# Patient Record
Sex: Female | Born: 1960 | Race: White | Hispanic: No | Marital: Single | Smoking: Never smoker
Health system: Southern US, Community
[De-identification: ages and names within clinical notes are randomized; demographics above are authoritative.]

## PROBLEM LIST (undated history)

## (undated) DIAGNOSIS — E042 Nontoxic multinodular goiter: Secondary | ICD-10-CM

## (undated) DIAGNOSIS — R32 Unspecified urinary incontinence: Secondary | ICD-10-CM

## (undated) DIAGNOSIS — Z8742 Personal history of other diseases of the female genital tract: Secondary | ICD-10-CM

## (undated) DIAGNOSIS — E282 Polycystic ovarian syndrome: Secondary | ICD-10-CM

## (undated) DIAGNOSIS — M797 Fibromyalgia: Secondary | ICD-10-CM

## (undated) DIAGNOSIS — E349 Endocrine disorder, unspecified: Secondary | ICD-10-CM

## (undated) DIAGNOSIS — E785 Hyperlipidemia, unspecified: Secondary | ICD-10-CM

## (undated) DIAGNOSIS — Z973 Presence of spectacles and contact lenses: Secondary | ICD-10-CM

## (undated) DIAGNOSIS — K219 Gastro-esophageal reflux disease without esophagitis: Secondary | ICD-10-CM

## (undated) DIAGNOSIS — F329 Major depressive disorder, single episode, unspecified: Secondary | ICD-10-CM

## (undated) DIAGNOSIS — N950 Postmenopausal bleeding: Secondary | ICD-10-CM

## (undated) DIAGNOSIS — N393 Stress incontinence (female) (male): Secondary | ICD-10-CM

## (undated) DIAGNOSIS — N981 Hyperstimulation of ovaries: Secondary | ICD-10-CM

## (undated) DIAGNOSIS — E739 Lactose intolerance, unspecified: Secondary | ICD-10-CM

## (undated) DIAGNOSIS — N979 Female infertility, unspecified: Secondary | ICD-10-CM

## (undated) DIAGNOSIS — F411 Generalized anxiety disorder: Secondary | ICD-10-CM

## (undated) DIAGNOSIS — N840 Polyp of corpus uteri: Secondary | ICD-10-CM

## (undated) DIAGNOSIS — N870 Mild cervical dysplasia: Secondary | ICD-10-CM

## (undated) DIAGNOSIS — Z86018 Personal history of other benign neoplasm: Secondary | ICD-10-CM

## (undated) DIAGNOSIS — Z9889 Other specified postprocedural states: Secondary | ICD-10-CM

## (undated) DIAGNOSIS — F419 Anxiety disorder, unspecified: Secondary | ICD-10-CM

## (undated) DIAGNOSIS — F32A Depression, unspecified: Secondary | ICD-10-CM

## (undated) HISTORY — DX: Anxiety disorder, unspecified: F41.9

## (undated) HISTORY — DX: Lactose intolerance, unspecified: E73.9

## (undated) HISTORY — DX: Polycystic ovarian syndrome: E28.2

## (undated) HISTORY — PX: FERTILITY SURGERY: SHX945

## (undated) HISTORY — DX: Endocrine disorder, unspecified: E34.9

## (undated) HISTORY — DX: Mild cervical dysplasia: N87.0

## (undated) HISTORY — DX: Female infertility, unspecified: N97.9

## (undated) HISTORY — PX: ABDOMINOPLASTY: SUR9

## (undated) HISTORY — DX: Hyperstimulation of ovaries: N98.1

## (undated) HISTORY — PX: OOPHORECTOMY: SHX86

## (undated) HISTORY — DX: Major depressive disorder, single episode, unspecified: F32.9

## (undated) HISTORY — PX: COSMETIC SURGERY: SHX468

## (undated) HISTORY — PX: COLPOSCOPY: SHX161

## (undated) HISTORY — PX: DILATION AND CURETTAGE OF UTERUS: SHX78

## (undated) HISTORY — PX: NECK SURGERY: SHX720

## (undated) HISTORY — DX: Hyperlipidemia, unspecified: E78.5

## (undated) HISTORY — DX: Depression, unspecified: F32.A

## (undated) HISTORY — DX: Unspecified urinary incontinence: R32

## (undated) HISTORY — PX: REDUCTION MAMMAPLASTY: SUR839

## (undated) HISTORY — DX: Fibromyalgia: M79.7

---

## 2002-05-15 HISTORY — PX: BREAST REDUCTION SURGERY: SHX8

## 2002-05-15 HISTORY — PX: ABDOMINAL SURGERY: SHX537

## 2017-09-27 ENCOUNTER — Ambulatory Visit: Payer: BC Managed Care – PPO | Admitting: Obstetrics and Gynecology

## 2017-09-27 ENCOUNTER — Other Ambulatory Visit (HOSPITAL_COMMUNITY)
Admission: RE | Admit: 2017-09-27 | Discharge: 2017-09-27 | Disposition: A | Discharge status: Home / Self Care | Payer: BC Managed Care – PPO | Source: Ambulatory Visit | Attending: Obstetrics and Gynecology | Admitting: Obstetrics and Gynecology

## 2017-09-27 ENCOUNTER — Encounter: Payer: Self-pay | Admitting: Obstetrics and Gynecology

## 2017-09-27 ENCOUNTER — Other Ambulatory Visit: Payer: Self-pay

## 2017-09-27 VITALS — BP 112/58 | HR 84 | Resp 16 | Ht 64.25 in | Wt 182.0 lb

## 2017-09-27 DIAGNOSIS — Z1151 Encounter for screening for human papillomavirus (HPV): Secondary | ICD-10-CM | POA: Insufficient documentation

## 2017-09-27 DIAGNOSIS — Z01419 Encounter for gynecological examination (general) (routine) without abnormal findings: Secondary | ICD-10-CM | POA: Diagnosis not present

## 2017-09-27 DIAGNOSIS — N393 Stress incontinence (female) (male): Secondary | ICD-10-CM

## 2017-09-27 DIAGNOSIS — Z113 Encounter for screening for infections with a predominantly sexual mode of transmission: Secondary | ICD-10-CM | POA: Insufficient documentation

## 2017-09-27 DIAGNOSIS — N951 Menopausal and female climacteric states: Secondary | ICD-10-CM | POA: Diagnosis not present

## 2017-09-27 DIAGNOSIS — Z23 Encounter for immunization: Secondary | ICD-10-CM

## 2017-09-27 NOTE — Progress Notes (Signed)
57 y.o. [id number] Single Caucasian female here as a new patient for annual exam and to discuss hormone problems. Patient moved from [place] in October 2018.  Having hot flashes.  Affecting her at work.  Also having anxiety and depression.  Mother and boyfriend died all within a short amount of time.  Started Cymbalta and Buspar one month ago, and hot flashes are less severe. [place] prescribing.  Had recent hormone testing at [facility].  FSH 66.1 and E2 9.5. Normal TSH.   Wants STD testing.   Some urinary leakage with sneeze and cough.  ? Prolapse.   Working as a Teacher, music. Dating.   PCP: No PCP    Patient's last menstrual period was 05/15/2016 (within months).           Sexually active: Yes.    The current method of family planning is condoms most of the time.    Exercising: Yes.    gym Smoker:  no  Health Maintenance: Pap:  Unsure of last pap History of abnormal Pap:  Yes, within the last 2-3 years per patient.  No treatment done.  MMG:  About 1-2 years ago Colonoscopy:  Unsure.  Wants Cologuard.  Already had one kit but did not send it in.  BMD:   Per patient done years ago due to Fibromyalgia TDaP:  Not up to date Gardasil:   n/a HIV: never Hep C: never Screening Labs:  Discuss today    reports that she has never smoked. She has never used smokeless tobacco. She reports that she drinks alcohol. She reports that she does not use drugs.  Past Medical History:  Diagnosis Date  . Anxiety   . Depression   . Hormone disorder   . Infertility, female   . Lactose intolerance   . Ovarian hyperstimulation syndrome   . PCOS (polycystic ovarian syndrome)   . Urinary incontinence     Past Surgical History:  Procedure Laterality Date  . ABDOMINAL SURGERY  2004  . BREAST REDUCTION SURGERY Bilateral 2004  . COLPOSCOPY    . COSMETIC SURGERY    . DILATION AND CURETTAGE OF UTERUS    . FERTILITY SURGERY    . OOPHORECTOMY      Current  Outpatient Medications  Medication Sig Dispense Refill  . busPIRone (BUSPAR) 15 MG tablet Take 1 tablet by mouth 2 (two) times daily.    . DULoxetine (CYMBALTA) 30 MG capsule Take 1 capsule by mouth 2 (two) times daily.    Marland Kitchen LORazepam (ATIVAN) 0.5 MG tablet as needed.     No current facility-administered medications for this visit.     Family History  Problem Relation Age of Onset  . Depression Mother   . Pancreatic cancer Mother   . Diabetes Mother   . Obesity Mother   . Heart Problems Mother   . Depression Father   . Cancer Maternal Grandmother   . Dementia Maternal Grandmother   . Breast cancer Maternal Grandmother   . ADD / ADHD Daughter     Review of Systems  Constitutional: Negative.   HENT: Negative.   Eyes: Negative.   Respiratory: Negative.   Cardiovascular: Negative.   Gastrointestinal: Negative.   Endocrine: Negative.   Genitourinary: Negative.   Musculoskeletal: Negative.   Skin: Negative.   Allergic/Immunologic: Negative.   Neurological: Negative.   Hematological: Negative.   Psychiatric/Behavioral: Negative.     Exam:   BP (!) 112/58 (BP Location: Right Arm, Patient Position: Sitting, Cuff Size:  Large)   Pulse 84   Resp 16   Ht 5' 4.25" (1.632 m)   Wt 182 lb (82.6 kg)   LMP 05/15/2016 (Within Months)   BMI 31.00 kg/m     General appearance: alert, cooperative and appears stated age Head: Normocephalic, without obvious abnormality, atraumatic Neck: no adenopathy, supple, symmetrical, trachea midline and thyroid normal to inspection and palpation Lungs: clear to auscultation bilaterally Breasts: normal appearance, no masses or tenderness, No nipple retraction or dimpling, No nipple discharge or bleeding, No axillary or supraclavicular adenopathy Heart: regular rate and rhythm Abdomen: soft, non-tender; no masses, no organomegaly Extremities: extremities normal, atraumatic, no cyanosis or edema Skin: Skin color, texture, turgor normal. No rashes or  lesions Lymph nodes: Cervical, supraclavicular, and axillary nodes normal. No abnormal inguinal nodes palpated Neurologic: Grossly normal  Pelvic: External genitalia:  no lesions              Urethra:  normal appearing urethra with no masses, tenderness or lesions              Bartholins and Skenes: normal                 Vagina: normal appearing vagina with normal color and discharge, no lesions              Cervix: no lesions              Pap taken: Yes.   Bimanual Exam:  Uterus:  normal size, contour, position, consistency, mobility, non-tender              Adnexa: no mass, fullness, tenderness              Rectal exam: Yes.  .  Confirms.              Anus:  normal sphincter tone, no lesions  Chaperone was present for exam.  Assessment:   Well woman visit with normal exam. STD screening.  GSI.  Bereavement and depression. Menopausal symptoms.   Plan: Mammogram screening. Information about mammogram screening centers in [place].  She will make an appointment.  Recommended self breast awareness. Pap and HR HPV as above. Guidelines for Calcium, Vitamin D, regular exercise program including cardiovascular and weight bearing exercise. Routine labs and STD screening.   Discussed Kegel's, PT, Impressa, and surgery for urinary incontience.  Cologuard.  TDap.  We discussed the multiple benefits of Cymbalta to treat depression and anxiety, urinary stress incontinence and menopausal vasomotor symptoms.  Name of [facility] to patient.  I do not recommend initiating HRT due to risks of DVT, PE, MI, stroke and breast cancer. Follow up annually and prn.   After visit summary provided.

## 2017-09-27 NOTE — Patient Instructions (Signed)

## 2017-09-28 LAB — CBC
Hematocrit: 39.2 % (ref 34.0–46.6)
Hemoglobin: 13.3 g/dL (ref 11.1–15.9)
MCH: 30.3 pg (ref 26.6–33.0)
MCHC: 33.9 g/dL (ref 31.5–35.7)
MCV: 89 fL (ref 79–97)
PLATELETS: 360 10*3/uL (ref 150–379)
RBC: 4.39 x10E6/uL (ref 3.77–5.28)
RDW: 13.6 % (ref 12.3–15.4)
WBC: 6.6 10*3/uL (ref 3.4–10.8)

## 2017-09-28 LAB — LIPID PANEL
CHOLESTEROL TOTAL: 236 mg/dL — AB (ref 100–199)
Chol/HDL Ratio: 5.9 ratio — ABNORMAL HIGH (ref 0.0–4.4)
HDL: 40 mg/dL (ref >39)
LDL CALC: 142 mg/dL — AB (ref 0–99)
TRIGLYCERIDES: 272 mg/dL — AB (ref 0–149)
VLDL CHOLESTEROL CAL: 54 mg/dL — AB (ref 5–40)

## 2017-09-28 LAB — VITAMIN D 25 HYDROXY (VIT D DEFICIENCY, FRACTURES): Vit D, 25-Hydroxy: 25.7 ng/mL — ABNORMAL LOW (ref 30.0–100.0)

## 2017-09-28 LAB — COMPREHENSIVE METABOLIC PANEL
ALT: 20 IU/L (ref 0–32)
AST: 18 IU/L (ref 0–40)
Albumin/Globulin Ratio: 2 (ref 1.2–2.2)
Albumin: 4.5 g/dL (ref 3.5–5.5)
Alkaline Phosphatase: 86 IU/L (ref 39–117)
BUN/Creatinine Ratio: 18 (ref 9–23)
BUN: 14 mg/dL (ref 6–24)
CO2: 24 mmol/L (ref 20–29)
Calcium: 9.8 mg/dL (ref 8.7–10.2)
Chloride: 106 mmol/L (ref 96–106)
Creatinine, Ser: 0.79 mg/dL (ref 0.57–1.00)
GFR calc non Af Amer: 85 mL/min/{1.73_m2} (ref >59)
GFR, EST AFRICAN AMERICAN: 98 mL/min/{1.73_m2} (ref >59)
Globulin, Total: 2.2 g/dL (ref 1.5–4.5)
Glucose: 94 mg/dL (ref 65–99)
Potassium: 4.8 mmol/L (ref 3.5–5.2)
Sodium: 145 mmol/L — ABNORMAL HIGH (ref 134–144)
TOTAL PROTEIN: 6.7 g/dL (ref 6.0–8.5)

## 2017-09-28 LAB — HEP, RPR, HIV PANEL
HIV SCREEN 4TH GENERATION: NONREACTIVE
Hepatitis B Surface Ag: NEGATIVE
RPR Ser Ql: NONREACTIVE

## 2017-09-28 LAB — HEPATITIS C ANTIBODY: Hep C Virus Ab: <0.1 s/co ratio (ref 0.0–0.9)

## 2017-09-29 DIAGNOSIS — N3946 Mixed incontinence: Secondary | ICD-10-CM | POA: Insufficient documentation

## 2017-09-29 DIAGNOSIS — N951 Menopausal and female climacteric states: Secondary | ICD-10-CM | POA: Insufficient documentation

## 2017-09-29 DIAGNOSIS — N393 Stress incontinence (female) (male): Secondary | ICD-10-CM | POA: Insufficient documentation

## 2017-10-01 ENCOUNTER — Encounter: Payer: Self-pay | Admitting: Obstetrics and Gynecology

## 2017-10-02 ENCOUNTER — Telehealth: Payer: Self-pay | Admitting: *Deleted

## 2017-10-02 LAB — CYTOLOGY - PAP
Chlamydia: NEGATIVE
DIAGNOSIS: NEGATIVE
HPV: NOT DETECTED
Neisseria Gonorrhea: NEGATIVE
TRICH (WINDOWPATH): NEGATIVE

## 2017-10-02 NOTE — Telephone Encounter (Signed)
-----   Message from Patton Salles, MD sent at 10/01/2017 11:13 AM EDT ----- Please contact patient with results from her office visit.  Her cholesterol is quite elevated, and I would like to have [place] set up an appointment for her to see a PCP to follow this.  She is currently at risk for cardiovascular disease development.  I do recommend she start a diet low in cholesterol and low in saturated fats.  A vigorous exercise plan will also be beneficial.  Her vitamin D level is low, and I recommend she start taking vit D3 800 IU daily.   Her blood chemistries showed a sodium level which is one point above normal.  Her blood counts are normal.   Testing is negative for HIV, syphilis, and hepatitis B and C.   Her pap is pending.

## 2017-10-02 NOTE — Telephone Encounter (Signed)
Notes recorded by Leda Min, RN on 10/02/2017 at 10:02 AM EDT Left message to call Noreene Larsson at [phone].

## 2017-10-05 NOTE — Telephone Encounter (Signed)
Notes recorded by Patton Salles, MD on 10/04/2017 at 6:07 AM EDT Results to patient through My Chart. Pap recall - 08. History of abnormal pap and no tx 2 - 3 years ago at out [place] office.  Hello Anna Chavez,  I am sharing your test results from your recent visit.   Your pap and high risk HPV testing are normal and negative.   Testing is negative for gonorrhea, chlamydia, and trichomonas.  Please contact the office for any questions.   Have a good day!  Conley Simmonds, MD

## 2017-10-09 ENCOUNTER — Other Ambulatory Visit: Payer: Self-pay | Admitting: Obstetrics and Gynecology

## 2017-10-09 DIAGNOSIS — Z1231 Encounter for screening mammogram for malignant neoplasm of breast: Secondary | ICD-10-CM

## 2017-10-09 NOTE — Telephone Encounter (Signed)
Spoke with patient, advised as seen below per Dr. Edward Jolly. Patient does not have PCP and declines assistance with scheduling with PCP. Advised patient to return call to office if assistance needed. Patient verbalizes understanding.   Pap results Viewed by Lawrence Marseilles on 10/08/2017 2:57 PM   Routing to provider for final review. Patient is agreeable to disposition. Will close encounter.

## 2017-11-09 ENCOUNTER — Inpatient Hospital Stay: Admission: RE | Admit: 2017-11-09 | Payer: BC Managed Care – PPO | Source: Ambulatory Visit

## 2017-11-19 ENCOUNTER — Ambulatory Visit
Admission: RE | Admit: 2017-11-19 | Discharge: 2017-11-19 | Disposition: A | Discharge status: Home / Self Care | Payer: BC Managed Care – PPO | Source: Ambulatory Visit | Attending: Obstetrics and Gynecology | Admitting: Obstetrics and Gynecology

## 2017-11-19 DIAGNOSIS — Z1231 Encounter for screening mammogram for malignant neoplasm of breast: Secondary | ICD-10-CM

## 2017-11-19 LAB — COLOGUARD: Cologuard: NEGATIVE

## 2017-11-27 ENCOUNTER — Telehealth: Payer: Self-pay | Admitting: Obstetrics and Gynecology

## 2017-11-27 DIAGNOSIS — Z1211 Encounter for screening for malignant neoplasm of colon: Secondary | ICD-10-CM

## 2017-11-27 DIAGNOSIS — Z1212 Encounter for screening for malignant neoplasm of rectum: Principal | ICD-10-CM

## 2017-11-27 NOTE — Telephone Encounter (Signed)
Please contact patient to report negative Cologuard test result.

## 2017-11-28 NOTE — Telephone Encounter (Signed)
No answer, unable to leave message °

## 2017-11-29 NOTE — Telephone Encounter (Signed)
Spoke with patient, advised as seen below per Dr. Edward JollySilva. Patient verbalizes understanding and is agreeable. Encounter closed.   Results entered into Epic. Copy of results to scan.

## 2018-01-08 ENCOUNTER — Encounter: Payer: Self-pay | Admitting: Obstetrics and Gynecology

## 2018-05-15 DIAGNOSIS — B009 Herpesviral infection, unspecified: Secondary | ICD-10-CM

## 2018-05-15 HISTORY — DX: Herpesviral infection, unspecified: B00.9

## 2018-06-12 ENCOUNTER — Ambulatory Visit (HOSPITAL_COMMUNITY)
Admission: RE | Admit: 2018-06-12 | Discharge: 2018-06-12 | Disposition: A | Discharge status: Home / Self Care | Payer: BC Managed Care – PPO | Attending: Psychiatry | Admitting: Psychiatry

## 2018-06-12 DIAGNOSIS — R45851 Suicidal ideations: Secondary | ICD-10-CM | POA: Insufficient documentation

## 2018-06-12 DIAGNOSIS — F332 Major depressive disorder, recurrent severe without psychotic features: Secondary | ICD-10-CM | POA: Insufficient documentation

## 2018-06-12 DIAGNOSIS — Z818 Family history of other mental and behavioral disorders: Secondary | ICD-10-CM | POA: Diagnosis not present
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
## 2018-06-13 ENCOUNTER — Telehealth (HOSPITAL_COMMUNITY): Payer: Self-pay | Admitting: Psychiatry

## 2018-10-11 ENCOUNTER — Other Ambulatory Visit: Payer: Self-pay

## 2018-10-11 ENCOUNTER — Ambulatory Visit: Payer: BC Managed Care – PPO | Admitting: Obstetrics and Gynecology

## 2018-10-11 ENCOUNTER — Telehealth: Payer: Self-pay | Admitting: Obstetrics and Gynecology

## 2018-10-11 ENCOUNTER — Encounter: Payer: Self-pay | Admitting: Obstetrics and Gynecology

## 2018-10-11 ENCOUNTER — Other Ambulatory Visit (HOSPITAL_COMMUNITY)
Admission: RE | Admit: 2018-10-11 | Discharge: 2018-10-11 | Disposition: A | Discharge status: Home / Self Care | Payer: BC Managed Care – PPO | Source: Ambulatory Visit | Attending: Obstetrics and Gynecology | Admitting: Obstetrics and Gynecology

## 2018-10-11 VITALS — BP 120/74 | HR 70 | Temp 97.8°F | Resp 16

## 2018-10-11 DIAGNOSIS — Z113 Encounter for screening for infections with a predominantly sexual mode of transmission: Secondary | ICD-10-CM | POA: Diagnosis not present

## 2018-10-11 DIAGNOSIS — N9089 Other specified noninflammatory disorders of vulva and perineum: Secondary | ICD-10-CM | POA: Diagnosis not present

## 2018-10-11 MED ORDER — VALACYCLOVIR HCL 1 G PO TABS: 1000.0000 mg | ORAL_TABLET | Freq: Two times a day (BID) | ORAL | 1 refills | Status: DC

## 2018-10-11 MED ORDER — LIDOCAINE 5 % EX OINT: 1.0000 "application " | TOPICAL_OINTMENT | Freq: Three times a day (TID) | CUTANEOUS | 1 refills | Status: DC

## 2018-10-11 NOTE — Telephone Encounter (Signed)
Patient is having vaginal discomfort, no further details given.

## 2018-10-11 NOTE — Progress Notes (Signed)
GYNECOLOGY  VISIT   HPI: 58 y.o.   Single  Caucasian  female   G2P0002 with No LMP recorded. Patient is perimenopausal.   here for bleeding and soreness after intercourse. Discharge noted.   Symptoms for a couple of days.   Recent new sexual partner after not being sexually active for over one year.  She states he said he was tested and negative for infection, but now she is worried.  States blood noted after intercourse but no spontaneous bleeding.    She noted swelling and throbbing in the vagina.  Vagina felt heavy and painful.  Also noted a pimple on the vaginal lip that has become a lump.  Clear discharge.  No odor.  No itching.  No use of lubricants or products.   She feels something dropping down in her vagina especially when when is standing. States she noted it with wiping and she is concerned about this.  She does have urinary incontinence.  States no pain with urination.  At first, it felt difficult to void, but this is easier now.  Denies hx of HSV 1.   Going to back to work on Monday since the Covid 19 pandemic began.   GYNECOLOGIC HISTORY: No LMP recorded. Patient is perimenopausal. Contraception:  Condoms Menopausal hormone therapy:  none Last mammogram:  11/20/17 BIRADS 1 negative/density b Last pap smear:   09/27/17 Neg:Neg HR HPV        OB History    Gravida  2   Para  2   Term  0   Preterm  0   AB  0   Living  2     SAB  0   TAB  0   Ectopic  0   Multiple  0   Live Births  0              Patient Active Problem List   Diagnosis Date Noted  . Stress incontinence 09/29/2017  . Menopausal symptoms 09/29/2017    Past Medical History:  Diagnosis Date  . Anxiety   . Depression   . Fibromyalgia   . Hormone disorder   . Hyperlipidemia   . Infertility, female   . Lactose intolerance   . Ovarian hyperstimulation syndrome   . PCOS (polycystic ovarian syndrome)   . Urinary incontinence     Past Surgical History:  Procedure  Laterality Date  . ABDOMINAL SURGERY  2004  . BREAST REDUCTION SURGERY Bilateral 2004  . COLPOSCOPY    . COSMETIC SURGERY    . DILATION AND CURETTAGE OF UTERUS    . FERTILITY SURGERY    . OOPHORECTOMY    . REDUCTION MAMMAPLASTY Bilateral     Current Outpatient Medications  Medication Sig Dispense Refill  . busPIRone (BUSPAR) 15 MG tablet Take 1 tablet by mouth 2 (two) times daily.    . DULoxetine (CYMBALTA) 30 MG capsule Take by mouth.     No current facility-administered medications for this visit.      ALLERGIES: Patient has no known allergies.  Family History  Problem Relation Age of Onset  . Depression Mother   . Pancreatic cancer Mother   . Diabetes Mother   . Obesity Mother   . Heart Problems Mother   . Depression Father   . Cancer Maternal Grandmother   . Dementia Maternal Grandmother   . Breast cancer Maternal Grandmother   . ADD / ADHD Daughter     Social History   Socioeconomic History  . Marital  status: Single    Spouse name: Not on file  . Number of children: Not on file  . Years of education: Not on file  . Highest education level: Not on file  Occupational History  . Not on file  Social Needs  . Financial resource strain: Not on file  . Food insecurity:    Worry: Not on file    Inability: Not on file  . Transportation needs:    Medical: Not on file    Non-medical: Not on file  Tobacco Use  . Smoking status: Never Smoker  . Smokeless tobacco: Never Used  Substance and Sexual Activity  . Alcohol use: Yes    Comment: occasional  . Drug use: Never  . Sexual activity: Yes    Birth control/protection: Post-menopausal  Lifestyle  . Physical activity:    Days per week: Not on file    Minutes per session: Not on file  . Stress: Not on file  Relationships  . Social connections:    Talks on phone: Not on file    Gets together: Not on file    Attends religious service: Not on file    Active member of club or organization: Not on file     Attends meetings of clubs or organizations: Not on file    Relationship status: Not on file  . Intimate partner violence:    Fear of current or ex partner: Not on file    Emotionally abused: Not on file    Physically abused: Not on file    Forced sexual activity: Not on file  Other Topics Concern  . Not on file  Social History Narrative  . Not on file    Review of Systems  Constitutional: Negative.   HENT: Negative.   Eyes: Negative.   Respiratory: Negative.   Cardiovascular: Negative.   Gastrointestinal: Negative.   Endocrine: Negative.   Genitourinary: Negative.        Bump on vagina, discharge, pain & bleeding after intercourse  Musculoskeletal: Negative.   Skin: Negative.   Allergic/Immunologic: Negative.   Neurological: Negative.   Psychiatric/Behavioral: Negative.     PHYSICAL EXAMINATION:    BP 120/74   Pulse 70   Temp 97.8 F (36.6 C) (Oral)   Resp 16     General appearance: anxious and tearful.  Pelvic: External genitalia:  Plaque of satellite ulceration on the right mons pubis.              Urethra:  normal appearing urethra with no masses, tenderness or lesions              Bartholins and Skenes: normal                 Vagina:  Inflammation of the vagina and cervix.  Multiple ulcerations of the vaginal apex and cervix.              Cervix:  See above.  Mucousy discharge.                Bimanual Exam:  Uterus:  normal size, contour, position, consistency, mobility, non-tender              Adnexa: no mass, fullness, tenderness      Chaperone was present for exam.  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  Minimal cystocele.  Situational anxiety.  PLAN  HSV 1 and 2 discussed in detail - route of infection, signs and symptoms, recurrences, asymptomatic shedding, antiviral treatment.  STD screening.  HSV 1  and 2 from [facility].  Valtrex 1000 mg po q 12 hours x 10 days.  She may take Valtrex 1000 mg daily for suppression dosing.  Support given for anxiety related to  likely dx.  FU for annual exam in 4 weeks.     An After Visit Summary was printed and given to the patient.  ___40___ minutes face to face time of which over 50% was spent in counseling.

## 2018-10-11 NOTE — Telephone Encounter (Signed)
Spoke with patient. Patient is anxious, states she is "freaking out after looking at WebMD". Describes painful bumps on vagina. Has a new partner, last SA 4-5 days ago. Reports feeling sore and bleeding after intercourse. "Wet" vaginal d/c, no odor. Patient is menopausal. Covid-pre-screen negative, precautions reviewed. OV scheduled for today at 4pm with Dr. Edward Jolly.   Encounter closed.

## 2018-10-11 NOTE — Patient Instructions (Signed)
Genital Herpes °Genital herpes is a common sexually transmitted infection (STI) that is caused by a virus. The virus spreads from person to person through sexual contact. Infection can cause itching, blisters, and sores around the genitals or rectum. Symptoms may last several days and then go away This is called an outbreak. However, the virus remains in your body, so you may have more outbreaks in the future. The time between outbreaks varies and can be months or years. °Genital herpes affects men and women. It is particularly concerning for pregnant women because the virus can be passed to the baby during delivery and can cause serious problems. Genital herpes is also a concern for people who have a weak disease-fighting (immune) system. °What are the causes? °This condition is caused by the herpes simplex virus (HSV) type 1 or type 2. The virus may spread through: °· Sexual contact with an infected person, including vaginal, anal, and oral sex. °· Contact with fluid from a herpes sore. °· The skin. This means that you can get herpes from an infected partner even if he or she does not have a visible sore or does not know that he or she is infected. °What increases the risk? °You are more likely to develop this condition if: °· You have sex with many partners. °· You do not use latex condoms during sex. °What are the signs or symptoms? °Most people do not have symptoms (asymptomatic) or have mild symptoms that may be mistaken for other skin problems. Symptoms may include: °· Small red bumps near the genitals, rectum, or mouth. These bumps turn into blisters and then turn into sores. °· Flu-like symptoms, including: °? Fever. °? Body aches. °? Swollen lymph nodes. °? Headache. °· Painful urination. °· Pain and itching in the genital area or rectal area. °· Vaginal discharge. °· Tingling or shooting pain in the legs and buttocks. °Generally, symptoms are more severe and last longer during the first (primary)  outbreak. Flu-like symptoms are also more common during the primary outbreak. °How is this diagnosed? °Genital herpes may be diagnosed based on: °· A physical exam. °· Your medical history. °· Blood tests. °· A test of a fluid sample (culture) from an open sore. °How is this treated? °There is no cure for this condition, but treatment with antiviral medicines that are taken by mouth (orally) can do the following: °· Speed up healing and relieve symptoms. °· Help to reduce the spread of the virus to sexual partners. °· Limit the chance of future outbreaks, or make future outbreaks shorter. °· Lessen symptoms of future outbreaks. °Your health care provider may also recommend pain relief medicines, such as aspirin or ibuprofen. °Follow these instructions at home: °Sexual activity °· Do not have sexual contact during active outbreaks. °· Practice safe sex. Latex condoms and female condoms may help prevent the spread of the herpes virus. °General instructions °· Keep the affected areas dry and clean. °· Take over-the-counter and prescription medicines only as told by your health care provider. °· Avoid rubbing or touching blisters and sores. If you do touch blisters or sores: °? Wash your hands thoroughly with soap and water. °? Do not touch your eyes afterward. °· To help relieve pain or itching, you may take the following actions as directed by your health care provider: °? Apply a cold, wet cloth (cold compress) to affected areas 4-6 times a day. °? Apply a substance that protects your skin and reduces bleeding (astringent). °? Apply a gel that   helps relieve pain around sores (lidocaine gel). °? Take a warm, shallow bath that cleans the genital area (sitz bath). °· Keep all follow-up visits as told by your health care provider. This is important. °How is this prevented? °· Use condoms. Although anyone can get genital herpes during sexual contact, even with the use of a condom, a condom can provide some  protection. °· Avoid having multiple sexual partners. °· Talk with your sexual partner about any symptoms either of you may have. Also, talk with your partner about any history of STIs. °· Get tested for STIs before you have sex. Ask your partner to do the same. °· Do not have sexual contact if you have symptoms of genital herpes. °Contact a health care provider if: °· Your symptoms are not improving with medicine. °· Your symptoms return. °· You have new symptoms. °· You have a fever. °· You have abdominal pain. °· You have redness, swelling, or pain in your eye. °· You notice new sores on other parts of your body. °· You are a woman and experience bleeding between menstrual periods. °· You have had herpes and you become pregnant or plan to become pregnant. °Summary °· Genital herpes is a common sexually transmitted infection (STI) that is caused by the herpes simplex virus (HSV) type 1 or type 2. °· These viruses are most often spread through sexual contact with an infected person. °· You are more likely to develop this condition if you have sex with many partners or you have unprotected sex. °· Most people do not have symptoms (asymptomatic) or have mild symptoms that may be mistaken for other skin problems. Symptoms occur as outbreaks that may happen months or years apart. °· There is no cure for this condition, but treatment with oral antiviral medicines can reduce symptoms, reduce the chance of spreading the virus to a partner, prevent future outbreaks, or shorten future outbreaks. °This information is not intended to replace advice given to you by your health care provider. Make sure you discuss any questions you have with your health care provider. °Document Released: 04/28/2000 Document Revised: 03/31/2016 Document Reviewed: 03/31/2016 °Elsevier Interactive Patient Education © 2019 Elsevier Inc. ° °

## 2018-10-12 LAB — HEP, RPR, HIV PANEL
HIV Screen 4th Generation wRfx: NONREACTIVE
Hepatitis B Surface Ag: NEGATIVE
RPR Ser Ql: NONREACTIVE

## 2018-10-12 LAB — HEPATITIS C ANTIBODY: Hep C Virus Ab: <0.1 s/co ratio (ref 0.0–0.9)

## 2018-10-14 ENCOUNTER — Telehealth: Payer: Self-pay | Admitting: Obstetrics and Gynecology

## 2018-10-14 NOTE — Telephone Encounter (Signed)
Left message to call Noreene Larsson, RN at [facility] [phone].     Written by Patton Salles, MD on 10/14/2018 12:50 PM  Hello Corrine,   I am sharing your test results from your recent visit.  Blood testing is negative for HIV, syphilis, and hepatitis B and C.   The remaining testing is still being processed.   Please contact the office for any questions.   I will be in touch when the remaining tests are ready.   Conley Simmonds, MD

## 2018-10-14 NOTE — Telephone Encounter (Signed)
Patient is calling for her recent results. She states she has an alert on MyChart but cannot access the results.

## 2018-10-14 NOTE — Telephone Encounter (Signed)
Patient is returning call to [facility]. Patient requested a call back between 12:30pm and 2pm tomorrow (6/2)

## 2018-10-15 LAB — CERVICOVAGINAL ANCILLARY ONLY
Chlamydia: NEGATIVE
Neisseria Gonorrhea: NEGATIVE
Trichomonas: NEGATIVE

## 2018-10-15 NOTE — Telephone Encounter (Signed)
Returned call to patient, left message to call me back.

## 2018-10-15 NOTE — Telephone Encounter (Signed)
Spoke with patient and reviewed negative blood work. Advised remaining testing is pending. She will call back by Friday if hasn't heard results from other tests.  She prefers results not to be sent through [facility] cannot access--prefers phone call.

## 2018-10-18 ENCOUNTER — Telehealth: Payer: Self-pay | Admitting: Obstetrics and Gynecology

## 2018-10-18 NOTE — Telephone Encounter (Signed)
Patient calling for any additional results.

## 2018-10-18 NOTE — Telephone Encounter (Signed)
Advised patient HSV testing still pending. We have called lab and they state it should be resulting during the weekend and we'll have results first of the week. Patient thanked me for calling her.

## 2018-10-19 LAB — HSV NAA
HSV 1 NAA: NEGATIVE
HSV 2 NAA: POSITIVE — AB

## 2018-10-21 ENCOUNTER — Encounter: Payer: Self-pay | Admitting: Obstetrics and Gynecology

## 2018-10-21 ENCOUNTER — Telehealth: Payer: Self-pay | Admitting: *Deleted

## 2018-10-21 NOTE — Telephone Encounter (Signed)
Spoke with patient and notified of positive HSV II. Offered follow up appointment to discuss further with Dr.Silva and she states she has appointment on 11-11-18 for AEX.

## 2018-10-21 NOTE — Telephone Encounter (Signed)
Notes recorded by Burnice Logan, RN on 10/21/2018 at 10:11 AM EDT Left message to call Sharee Pimple, RN at [place]

## 2018-10-21 NOTE — Telephone Encounter (Signed)
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  We discussed probable herpes diagnosis at the time of her office visit.  She may wish to schedule a follow up visit with me. I would recommend an in office visit if she desires the follow up.

## 2018-10-28 ENCOUNTER — Other Ambulatory Visit: Payer: Self-pay | Admitting: Obstetrics and Gynecology

## 2018-10-28 DIAGNOSIS — Z1231 Encounter for screening mammogram for malignant neoplasm of breast: Secondary | ICD-10-CM

## 2018-11-02 ENCOUNTER — Other Ambulatory Visit: Payer: Self-pay | Admitting: Obstetrics and Gynecology

## 2018-11-04 NOTE — Telephone Encounter (Signed)
Medication refill request: valtrex 1000 Last AEX:  09/27/17 Next AEX: 11/11/18 Last MMG (if hormonal medication request): 11/19/17 Bi-rads 1 Neg  Refill authorized: #50 with 1 RF

## 2018-11-11 ENCOUNTER — Encounter: Payer: Self-pay | Admitting: Obstetrics and Gynecology

## 2018-11-11 ENCOUNTER — Ambulatory Visit: Payer: BC Managed Care – PPO | Admitting: Obstetrics and Gynecology

## 2018-11-11 ENCOUNTER — Other Ambulatory Visit: Payer: Self-pay

## 2018-11-11 ENCOUNTER — Other Ambulatory Visit (HOSPITAL_COMMUNITY)
Admission: RE | Admit: 2018-11-11 | Discharge: 2018-11-11 | Disposition: A | Discharge status: Home / Self Care | Payer: BC Managed Care – PPO | Source: Ambulatory Visit | Attending: Obstetrics and Gynecology | Admitting: Obstetrics and Gynecology

## 2018-11-11 VITALS — BP 118/64 | HR 76 | Temp 97.5°F | Resp 14 | Ht 64.0 in | Wt 187.0 lb

## 2018-11-11 DIAGNOSIS — Z01419 Encounter for gynecological examination (general) (routine) without abnormal findings: Secondary | ICD-10-CM

## 2018-11-11 MED ORDER — VALACYCLOVIR HCL 1 G PO TABS: ORAL_TABLET | ORAL | 11 refills | Status: DC

## 2018-11-11 NOTE — Progress Notes (Signed)
58 y.o. G22P0002 Single Caucasian female here for annual exam.    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  She presented with symptoms consistent with a primary herpes outbreak on 10/11/18 shortly after becoming intimate with her new partner.  States her boyfriend tested negative for HSV per his report.  She has testing negative for other STDs.  Asking about HPV.   PCP: [place].    No LMP recorded. Patient is perimenopausal.           Sexually active: Yes.    The current method of family planning is none.    Exercising: Yes.    walking and exercise classes Smoker:  no  Health Maintenance: Pap:  09/27/17 Neg:Neg HR HPV History of abnormal Pap:  Yes, within the last few years per patient.  No treatment done.  MMG:  11/19/17 BIRADS 1 negative/density b.  She will schedule. Colonoscopy:  Cologuard 11/07/2017 - negative.   BMD:   Done in the past TDaP:  09/27/17 HIV and Hep C: 10/11/18 Negative Screening Labs: discuss if needed   reports that she has never smoked. She has never used smokeless tobacco. She reports current alcohol use. She reports that she does not use drugs.  Past Medical History:  Diagnosis Date  . Anxiety   . Depression   . Fibromyalgia   . Herpes simplex type 2 infection 2020  . Hormone disorder   . Hyperlipidemia   . Infertility, female   . Lactose intolerance   . Ovarian hyperstimulation syndrome   . PCOS (polycystic ovarian syndrome)   . Urinary incontinence     Past Surgical History:  Procedure Laterality Date  . ABDOMINAL SURGERY  2004  . BREAST REDUCTION SURGERY Bilateral 2004  . COLPOSCOPY    . COSMETIC SURGERY    . DILATION AND CURETTAGE OF UTERUS    . FERTILITY SURGERY    . OOPHORECTOMY    . REDUCTION MAMMAPLASTY Bilateral     Current Outpatient Medications  Medication Sig Dispense Refill  . busPIRone (BUSPAR) 15 MG tablet Take 1 tablet by mouth 2 (two) times daily.    . DULoxetine (CYMBALTA) 30 MG capsule Take by mouth.    .  valACYclovir (VALTREX) 1000 MG tablet Take one tablet (500 mg) by mouth daily.  Take one tablet by mouth twice daily for an outbreak. 40 tablet 0  . lidocaine (XYLOCAINE) 5 % ointment Apply 1 application topically 3 (three) times daily. (Patient not taking: Reported on 11/11/2018) 1.25 g 1   No current facility-administered medications for this visit.     Family History  Problem Relation Age of Onset  . Depression Mother   . Pancreatic cancer Mother   . Diabetes Mother   . Obesity Mother   . Heart Problems Mother   . Depression Father   . Cancer Maternal Grandmother   . Dementia Maternal Grandmother   . Breast cancer Maternal Grandmother   . ADD / ADHD Daughter     Review of Systems  Constitutional: Negative.   HENT: Negative.   Eyes: Negative.   Respiratory: Negative.   Cardiovascular: Negative.   Gastrointestinal: Negative.   Endocrine: Negative.   Genitourinary: Negative.   Musculoskeletal: Negative.   Skin: Negative.   Allergic/Immunologic: Negative.   Neurological: Negative.   Hematological: Negative.   Psychiatric/Behavioral: Negative.     Exam:   BP 118/64 (BP Location: Right Arm, Patient Position: Sitting, Cuff Size: Large)   Pulse 76   Temp Marland Kitchen)  97.5 F (36.4 C) (Temporal)   Resp 14   Ht 5\' 4"  (1.626 m)   Wt 187 lb (84.8 kg)   BMI 32.10 kg/m     General appearance: alert, cooperative and appears stated age Head: normocephalic, without obvious abnormality, atraumatic Neck: no adenopathy, supple, symmetrical, trachea midline and thyroid normal to inspection and palpation Lungs: clear to auscultation bilaterally Breasts: bilateral breast reduction scars, no masses or tenderness, No nipple retraction or dimpling, No nipple discharge or bleeding, No axillary adenopathy Heart: regular rate and rhythm Abdomen: abdominoplasty scars, abdomen is soft, non-tender; no masses, no organomegaly Extremities: extremities normal, atraumatic, no cyanosis or edema Skin: skin  color, texture, turgor normal. No rashes or lesions Lymph nodes: cervical, supraclavicular, and axillary nodes normal. Neurologic: grossly normal  Pelvic: External genitalia:  no lesions              No abnormal inguinal nodes palpated.              Urethra:  normal appearing urethra with no masses, tenderness or lesions              Bartholins and Skenes: normal                 Vagina: normal appearing vagina with normal color and discharge, no lesions              Cervix: no lesions              Pap taken: Yes.   Bimanual Exam:  Uterus:  normal size, contour, position, consistency, mobility, non-tender              Adnexa: no mass, fullness, tenderness              Rectal exam: Yes.  .  Confirms.              Anus:  normal sphincter tone, no lesions  Chaperone was present for exam.  Assessment:   Well woman visit with normal exam. HSV 2.  Hx abnormal pap. Bilateral breast reduction.  Abdominoplasty.  Hyperlipidemia.   Plan: Mammogram screening discussed. Self breast awareness reviewed. Pap and HR HPV as above. Guidelines for Calcium, Vitamin D, regular exercise program including cardiovascular and weight bearing exercise. She will see PCP for labs.  We reviewed HSV and HPV today.  Refill of Valtrex 500 mg daily for one year.  Follow up annually and prn.  After visit summary provided.

## 2018-11-11 NOTE — Patient Instructions (Signed)

## 2018-11-13 LAB — CYTOLOGY - PAP
Diagnosis: NEGATIVE
HPV: NOT DETECTED

## 2018-12-30 ENCOUNTER — Other Ambulatory Visit: Payer: Self-pay

## 2018-12-30 ENCOUNTER — Ambulatory Visit
Admission: RE | Admit: 2018-12-30 | Discharge: 2018-12-30 | Disposition: A | Discharge status: Home / Self Care | Payer: BC Managed Care – PPO | Source: Ambulatory Visit | Attending: Obstetrics and Gynecology | Admitting: Obstetrics and Gynecology

## 2018-12-30 DIAGNOSIS — Z1231 Encounter for screening mammogram for malignant neoplasm of breast: Secondary | ICD-10-CM

## 2019-03-04 ENCOUNTER — Telehealth: Payer: Self-pay | Admitting: Obstetrics and Gynecology

## 2019-03-04 NOTE — Telephone Encounter (Signed)
Patient left voicemail over lunch requesting a call from a nurse. Patient is experiencing vaginal itching and would like to know the name of the medication that she has previously taken for this problem. Patient stated that she may not be able to answer the phone, but a message can be left on her voicemail with the name of the medication.

## 2019-03-04 NOTE — Telephone Encounter (Signed)
Left message to call Colletta Maryland,  RN at [facility].   Last AEX 11/11/18

## 2019-03-05 NOTE — Telephone Encounter (Signed)
Triamcinolone and lidocaine can both be used externally.  She can actually get the lidocaine at a pharmacy without a prescription. If she has internal vaginal itching, I recommend an office visit for vaginitis evaluation.   For her hot flashes, she may want to try some Estroven, which is an herbal remedy for hot flashes.

## 2019-03-05 NOTE — Telephone Encounter (Signed)
Returned call to patient. Patient complaining of vaginal itching. Unsure if it is related to vaginal spray she is using- does change type of spray frequently. States she feels better other than the itching. Denies discharge, odor, urinary symptoms, pelvic pain or fever/chills. States she just wants to know what cream Dr. Quincy Simmonds had called in for her. RN advised lidocaine ointment called in at the end of May. Patient states she was unsure if she could use "down there." RN advised to apply pea amount. Can use 3 times a day. Patient verbalized understanding. Also complaining of hot flashes that have been ongoing. RN advised OV recommended to evaluate itching and hot flashes. Patient declined- stating she can't take off work. Patient states her question was answered about the lidocaine ointment, but she wants to know if she can use a prescription she has on hand, triamcinolone, in conjunction with the lidocaine? RN advised would review with Dr. Quincy Simmonds and return call. Patient agreeable.   Routing to provider for review.

## 2019-03-05 NOTE — Telephone Encounter (Signed)
Patient is returning call to Stephanie.

## 2019-03-06 NOTE — Telephone Encounter (Signed)
Spoke with patient, advised per Dr. Quincy Simmonds. Patient declines OV at this time, will return call if new symptoms develop.   Encounter closed.

## 2019-05-12 DIAGNOSIS — Z8616 Personal history of COVID-19: Secondary | ICD-10-CM

## 2019-05-12 HISTORY — DX: Personal history of COVID-19: Z86.16

## 2019-05-19 ENCOUNTER — Other Ambulatory Visit: Payer: Self-pay

## 2019-05-19 ENCOUNTER — Encounter (HOSPITAL_COMMUNITY): Payer: Self-pay | Admitting: Emergency Medicine

## 2019-05-19 ENCOUNTER — Emergency Department (HOSPITAL_COMMUNITY)
Admission: EM | Admit: 2019-05-19 | Discharge: 2019-05-20 | Disposition: A | Discharge status: Home / Self Care | Payer: BC Managed Care – PPO | Attending: Emergency Medicine | Admitting: Emergency Medicine

## 2019-05-19 DIAGNOSIS — U071 COVID-19: Secondary | ICD-10-CM

## 2019-05-19 DIAGNOSIS — R0602 Shortness of breath: Secondary | ICD-10-CM | POA: Diagnosis present

## 2019-05-19 DIAGNOSIS — Z79899 Other long term (current) drug therapy: Secondary | ICD-10-CM | POA: Insufficient documentation

## 2019-05-19 LAB — CBC WITH DIFFERENTIAL/PLATELET
Abs Immature Granulocytes: 0.04 10*3/uL (ref 0.00–0.07)
Basophils Absolute: 0 10*3/uL (ref 0.0–0.1)
Basophils Relative: 0 %
Eosinophils Absolute: 0 10*3/uL (ref 0.0–0.5)
Eosinophils Relative: 0 %
HCT: 43.3 % (ref 36.0–46.0)
Hemoglobin: 14.3 g/dL (ref 12.0–15.0)
Immature Granulocytes: 1 %
Lymphocytes Relative: 18 %
Lymphs Abs: 1.1 10*3/uL (ref 0.7–4.0)
MCH: 31.4 pg (ref 26.0–34.0)
MCHC: 33 g/dL (ref 30.0–36.0)
MCV: 95.2 fL (ref 80.0–100.0)
Monocytes Absolute: 0.4 10*3/uL (ref 0.1–1.0)
Monocytes Relative: 6 %
Neutro Abs: 4.3 10*3/uL (ref 1.7–7.7)
Neutrophils Relative %: 75 %
Platelets: 244 10*3/uL (ref 150–400)
RBC: 4.55 MIL/uL (ref 3.87–5.11)
RDW: 12.4 % (ref 11.5–15.5)
WBC: 5.8 10*3/uL (ref 4.0–10.5)
nRBC: 0 % (ref 0.0–0.2)

## 2019-05-19 LAB — COMPREHENSIVE METABOLIC PANEL
ALT: 25 U/L (ref 0–44)
AST: 35 U/L (ref 15–41)
Albumin: 3.7 g/dL (ref 3.5–5.0)
Alkaline Phosphatase: 66 U/L (ref 38–126)
Anion gap: 11 (ref 5–15)
BUN: 11 mg/dL (ref 6–20)
CO2: 23 mmol/L (ref 22–32)
Calcium: 9.1 mg/dL (ref 8.9–10.3)
Chloride: 106 mmol/L (ref 98–111)
Creatinine, Ser: 0.88 mg/dL (ref 0.44–1.00)
GFR calc Af Amer: >60 mL/min (ref >60)
GFR calc non Af Amer: >60 mL/min (ref >60)
Glucose, Bld: 153 mg/dL — ABNORMAL HIGH (ref 70–99)
Potassium: 3.8 mmol/L (ref 3.5–5.1)
Sodium: 140 mmol/L (ref 135–145)
Total Bilirubin: 0.7 mg/dL (ref 0.3–1.2)
Total Protein: 7.1 g/dL (ref 6.5–8.1)

## 2019-05-19 LAB — I-STAT BETA HCG BLOOD, ED (MC, WL, AP ONLY): I-stat hCG, quantitative: 5.4 m[IU]/mL — ABNORMAL HIGH (ref <5)

## 2019-05-19 NOTE — ED Triage Notes (Signed)
Pt tested positive for COVID on the 12/31 with increase symptoms. Tylenol given 1g  pta to ED. BP 128/86, HR 107, SPO2 97% RA, R-18, temp 99.1. Pt AO x 4 NAD noticed.

## 2019-05-20 ENCOUNTER — Emergency Department (HOSPITAL_COMMUNITY): Payer: BC Managed Care – PPO

## 2019-05-20 MED ORDER — BENZONATATE 100 MG PO CAPS: 100.0000 mg | ORAL_CAPSULE | Freq: Three times a day (TID) | ORAL | 0 refills | Status: DC | PRN

## 2019-05-20 MED ORDER — SODIUM CHLORIDE 0.9 % IV BOLUS: 500.0000 mL | Freq: Once | INTRAVENOUS | Status: DC

## 2019-05-20 MED ORDER — BENZONATATE 100 MG PO CAPS
100.0000 mg | ORAL_CAPSULE | Freq: Once | ORAL | Status: AC
  Administered 2019-05-20: 100 mg via ORAL
  Filled 2019-05-20: qty 1

## 2019-05-20 NOTE — Discharge Instructions (Signed)
Please take your medications, as prescribed.  Please continue take Tylenol for any fevers, chills, or body aches.  You should expect to receive a phone call from the hospital to home program.  They have your information.  Please follow-up with your PCP, Dr. Earnest Bailey, for ongoing evaluation and management.  Return to the ED or seek medical attention for any new or worsening symptoms.

## 2019-05-20 NOTE — ED Notes (Signed)
Patient verbalizes understanding of discharge instructions. Opportunity for questioning and answers were provided. Armband removed by staff, pt discharged from ED.  

## 2019-05-20 NOTE — ED Provider Notes (Signed)
[facility] EMERGENCY DEPARTMENT Provider Note   CSN: [id number] Arrival date & time: 05/19/19  1953     History Chief Complaint  Patient presents with  . Shortness of Breath    Anna Chavez is a 59 y.o. female with PMH significant for HLD, PCOS, asthma, anxiety, depression, and most notably positive COVID-19 testing obtained 05/12/2019 who presents to the ED with worsening shortness of breath symptoms.  Patient states that she became symptomatic of COVID-19 with fevers, chills, body aches, cough, and shortness of breath on 05/10/2019 and found out that she was positive for COVID-19 05/14/2019.  She reports in the past couple of days her fatigue and diminished p.o. intake has worsened and she feels weak and short of breath.  She also endorses significant body aches.  She uses a pulse oximeter at home which noted oxygenation in the 80s, but admits that it may have been partially attributable to her nail polish.  Regardless, I prompted her to come in to the ED for evaluation.  She has been taking Tylenol as well as baby aspirin, but has not taken any antitussives.  She denies any nausea or vomiting, chest pain, headache or dizziness, sudden onset worsening of symptoms, pleuritic chest pain or pleuritic cough, leg swelling, recent surgeries, history of clots/PE/coagulopathy, or other symptoms.  Patient is simply anxious and concerned because she lives alone at home.   HPI     Past Medical History:  Diagnosis Date  . Anxiety   . Depression   . Fibromyalgia   . Herpes simplex type 2 infection 2020  . Hormone disorder   . Hyperlipidemia   . Infertility, female   . Lactose intolerance   . Ovarian hyperstimulation syndrome   . PCOS (polycystic ovarian syndrome)   . Urinary incontinence     Patient Active Problem List   Diagnosis Date Noted  . Stress incontinence 09/29/2017  . Menopausal symptoms 09/29/2017    Past Surgical History:  Procedure Laterality Date   . ABDOMINAL SURGERY  2004  . BREAST REDUCTION SURGERY Bilateral 2004  . COLPOSCOPY    . COSMETIC SURGERY    . DILATION AND CURETTAGE OF UTERUS    . FERTILITY SURGERY    . OOPHORECTOMY    . REDUCTION MAMMAPLASTY Bilateral      OB History    Gravida  2   Para  2   Term  0   Preterm  0   AB  0   Living  2     SAB  0   TAB  0   Ectopic  0   Multiple  0   Live Births  0           Family History  Problem Relation Age of Onset  . Depression Mother   . Pancreatic cancer Mother   . Diabetes Mother   . Obesity Mother   . Heart Problems Mother   . Depression Father   . Cancer Maternal Grandmother   . Dementia Maternal Grandmother   . Breast cancer Maternal Grandmother   . ADD / ADHD Daughter     Social History   Tobacco Use  . Smoking status: Never Smoker  . Smokeless tobacco: Never Used  Substance Use Topics  . Alcohol use: Yes    Comment: occasional  . Drug use: Never    Home Medications Prior to Admission medications   Medication Sig Start Date End Date Taking? Authorizing Provider  busPIRone (BUSPAR) 15 MG tablet  Take 1 tablet by mouth 2 (two) times daily. 09/21/17   [provider]  DULoxetine (CYMBALTA) 30 MG capsule Take by mouth. 08/29/17   [provider]  lidocaine (XYLOCAINE) 5 % ointment Apply 1 application topically 3 (three) times daily. Patient not taking: Reported on 11/11/2018 10/11/18   Nunzio Cobbs, MD  valACYclovir (VALTREX) 1000 MG tablet Take one tablet (500 mg) by mouth daily.  Take one tablet by mouth twice daily for an outbreak. 11/11/18   Nunzio Cobbs, MD    Allergies    Patient has no known allergies.  Review of Systems   Review of Systems  All other systems reviewed and are negative.   Physical Exam Updated Vital Signs BP 118/86   Pulse 96   Temp 98.8 F (37.1 C) (Oral)   Resp (!) 22   Ht 5\' 6"  (1.676 m)   Wt 80.7 kg   SpO2 97%   BMI 28.73 kg/m   Physical Exam    ED Results / Procedures / Treatments   Labs (all labs ordered are listed, but only abnormal results are displayed) Labs Reviewed  COMPREHENSIVE METABOLIC PANEL - Abnormal; Notable for the following components:      Result Value   Glucose, Bld 153 (*)    All other components within normal limits  I-STAT BETA HCG BLOOD, ED ([facility], [facility], [facility] ONLY) - Abnormal; Notable for the following components:   I-stat hCG, quantitative 5.4 (*)    All other components within normal limits  CBC WITH DIFFERENTIAL/PLATELET    EKG EKG Interpretation  Date/Time:  Monday May 19 2019 19:51:21 EST Ventricular Rate:  106 PR Interval:  134 QRS Duration: 84 QT Interval:  334 QTC Calculation: 443 R Axis:   75 Text Interpretation: Sinus tachycardia Otherwise normal ECG No old tracing to compare Confirmed by Delora Fuel ([id number]) on 05/20/2019 1:31:31 AM   Radiology DG Chest Portable 1 View  Result Date: 05/20/2019 CLINICAL DATA:  Cough, shortness of breath EXAM: PORTABLE CHEST 1 VIEW COMPARISON:  None. FINDINGS: The heart size and mediastinal contours are within normal limits. Subtle hazy airspace opacities within the more peripheral aspects of the bilateral lung bases, left worse than right. No pleural effusion. No pneumothorax. IMPRESSION: Subtle hazy airspace opacities within the more peripheral aspects of the bilateral lung bases, left greater than right, suspicious for multifocal atypical/viral pneumonia. Electronically Signed   By: Davina Poke D.O.   On: 05/20/2019 11:32    Procedures Procedures (including critical care time)  Medications Ordered in ED Medications  benzonatate (TESSALON) capsule 100 mg (has no administration in time range)  sodium chloride 0.9 % bolus 500 mL (has no administration in time range)    ED Course  I have reviewed the triage vital signs and the nursing notes.  Pertinent labs & imaging results that were available during my care of the patient were reviewed by me and  considered in my medical decision making (see chart for details).    MDM Rules/Calculators/A&P                      EKG demonstrates mild sinus tachycardia, but otherwise normal EKG with no prior tracings with which to compare.  Patient admits that she has not been eating or drinking very well and she is likely a bit dehydrated.  We will resuscitate with 500 mL NS.  Patient declined so offered water p.o. which she gladly accepted and was able  to tolerate without issue.  Also will provide her with a benzonatate here in the ED, per her request.  Chest x-ray demonstrates hazy airspace opacities concerning for multifocal viral pneumonia.    Vital signs are all within normal limits on my exam.  CBC demonstrates no abnormalities and CMP also reassuring.  Will ambulate on pulse oximetry and then discharged with prescription for benzonatate.  Patient was initially anxious and upset because she felt as though she would need to be admitted for her pneumonia, but explained to her why at this time she is safe for discharge given her reassuring work-up and vital signs.  Instructed her to follow-up with her primary care provider for ongoing evaluation and management.  Informed her that this illness is self-limiting.  Encouraged her to continue wearing her pulse oximetry at home and to remove her nail polish for better accuracy.  Referred to the new COVID-19 hospital home program.  Emphasized strict return precautions.  All of the evaluation and work-up results were discussed with the patient and any family at bedside. They were provided opportunity to ask any additional questions and have none at this time. They have expressed understanding of verbal discharge instructions as well as return precautions and are agreeable to the plan.   Anna Chavez was evaluated in Emergency Department on 05/20/2019 for the symptoms described in the history of present illness. She was evaluated in the context of the global  COVID-19 pandemic, which necessitated consideration that the patient might be at risk for infection with the SARS-CoV-2 virus that causes COVID-19. Institutional protocols and algorithms that pertain to the evaluation of patients at risk for COVID-19 are in a state of rapid change based on information released by regulatory bodies including the CDC and federal and state organizations. These policies and algorithms were followed during the patient's care in the ED.   Final Clinical Impression(s) / ED Diagnoses Final diagnoses:  COVID-19    Rx / DC Orders ED Discharge Orders    None       Lorelee New, PA-C 05/20/19 1335    Derwood Kaplan, MD 05/20/19 1520

## 2019-05-20 NOTE — ED Notes (Signed)
Ambulated pt in room, pt SpO2 dropped from 100% to 95%. While pt ambulated SpO2 went back up to 97%. Notified [place](RN)

## 2019-06-11 ENCOUNTER — Telehealth: Payer: Self-pay | Admitting: Obstetrics and Gynecology

## 2019-06-11 NOTE — Telephone Encounter (Signed)
Erroneous encounter

## 2019-07-04 ENCOUNTER — Ambulatory Visit: Payer: BC Managed Care – PPO | Attending: Internal Medicine

## 2019-07-04 DIAGNOSIS — Z23 Encounter for immunization: Secondary | ICD-10-CM | POA: Insufficient documentation

## 2019-07-04 NOTE — Progress Notes (Signed)
   Covid-19 Vaccination Clinic  Name:  Anna Chavez    MRN: [id number] DOB: 02-Feb-1961  07/04/2019  [name] was observed post Covid-19 immunization for 15 minutes without incidence. She was provided with Vaccine Information Sheet and instruction to access the V-Safe system.   [name] was instructed to call 911 with any severe reactions post vaccine: Marland Kitchen Difficulty breathing  . Swelling of your face and throat  . A fast heartbeat  . A bad rash all over your body  . Dizziness and weakness    Immunizations Administered    Name Date Dose VIS Date Route   Pfizer COVID-19 Vaccine 07/04/2019  4:00 PM 0.3 mL 04/25/2019 Intramuscular   Manufacturer: [employer], [employer]   Lot: [id number]   NDC: [address]-3

## 2019-07-29 ENCOUNTER — Ambulatory Visit: Payer: BC Managed Care – PPO | Attending: Internal Medicine

## 2019-07-29 DIAGNOSIS — Z23 Encounter for immunization: Secondary | ICD-10-CM

## 2019-07-29 NOTE — Progress Notes (Signed)
   Covid-19 Vaccination Clinic  Name:  Anna Chavez    MRN: [id number] DOB: 06-09-1960  07/29/2019  Anna Chavez was observed post Covid-19 immunization for 15 minutes without incident. She was provided with Vaccine Information Sheet and instruction to access the V-Safe system.   Anna Chavez was instructed to call 911 with any severe reactions post vaccine: Marland Kitchen Difficulty breathing  . Swelling of face and throat  . A fast heartbeat  . A bad rash all over body  . Dizziness and weakness   Immunizations Administered    Name Date Dose VIS Date Route   Pfizer COVID-19 Vaccine 07/29/2019  5:02 PM 0.3 mL 04/25/2019 Intramuscular   Manufacturer: [employer], [employer]   Lot: [id number]   NDC: [address]-9

## 2019-08-29 IMAGING — MG DIGITAL SCREENING BILATERAL MAMMOGRAM WITH CAD
4 series · 4 of 4 positions shown · non-contrast
Comparison: Previous exam(s).

CLINICAL DATA: Screening.

EXAM:
DIGITAL SCREENING BILATERAL MAMMOGRAM WITH CAD

[L MLO]
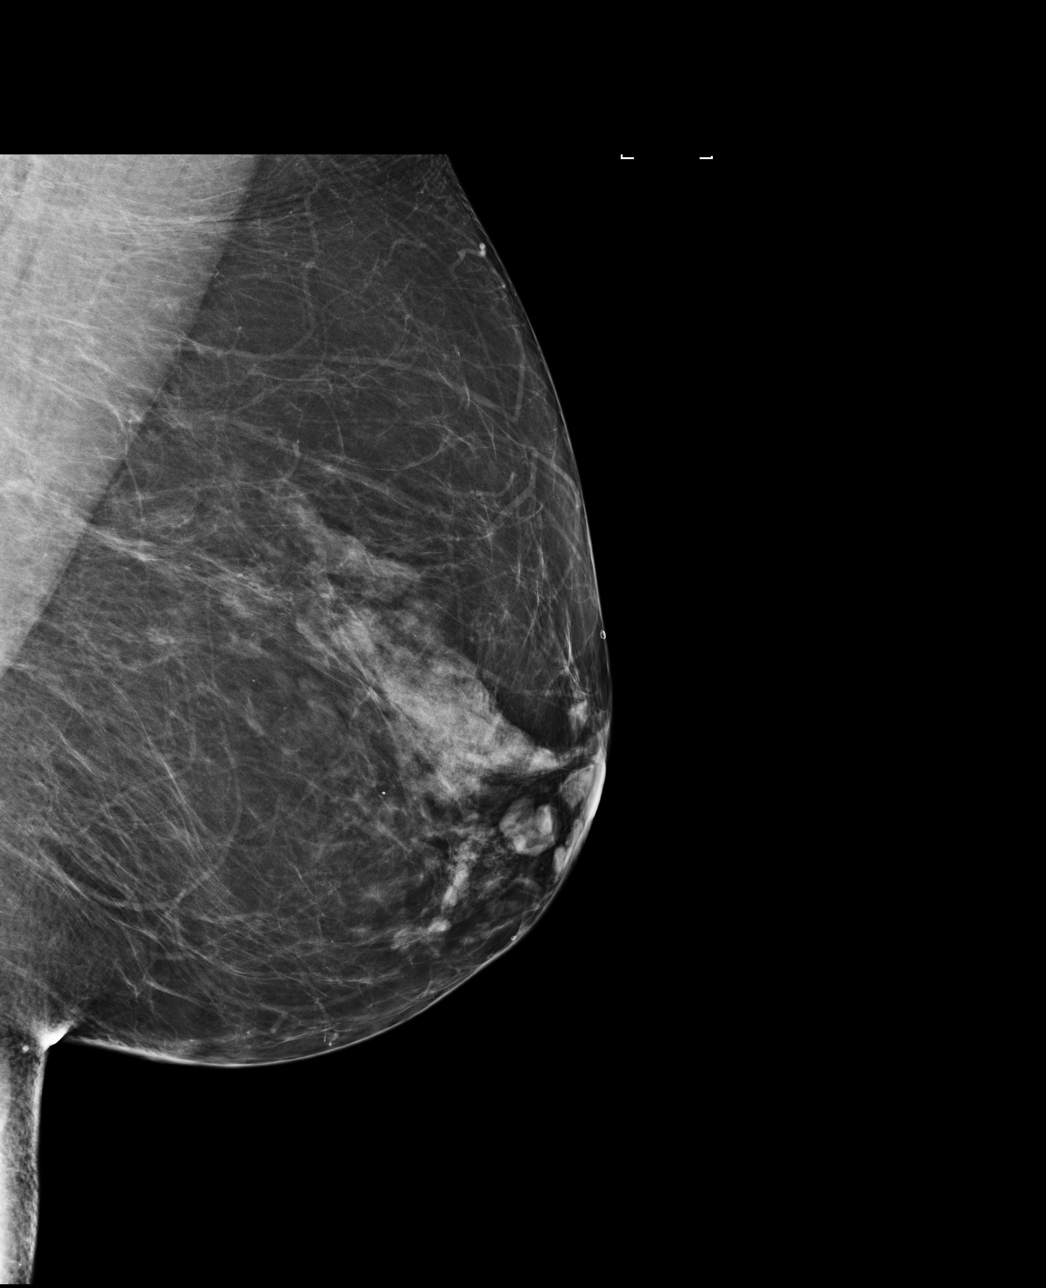

[L CC]
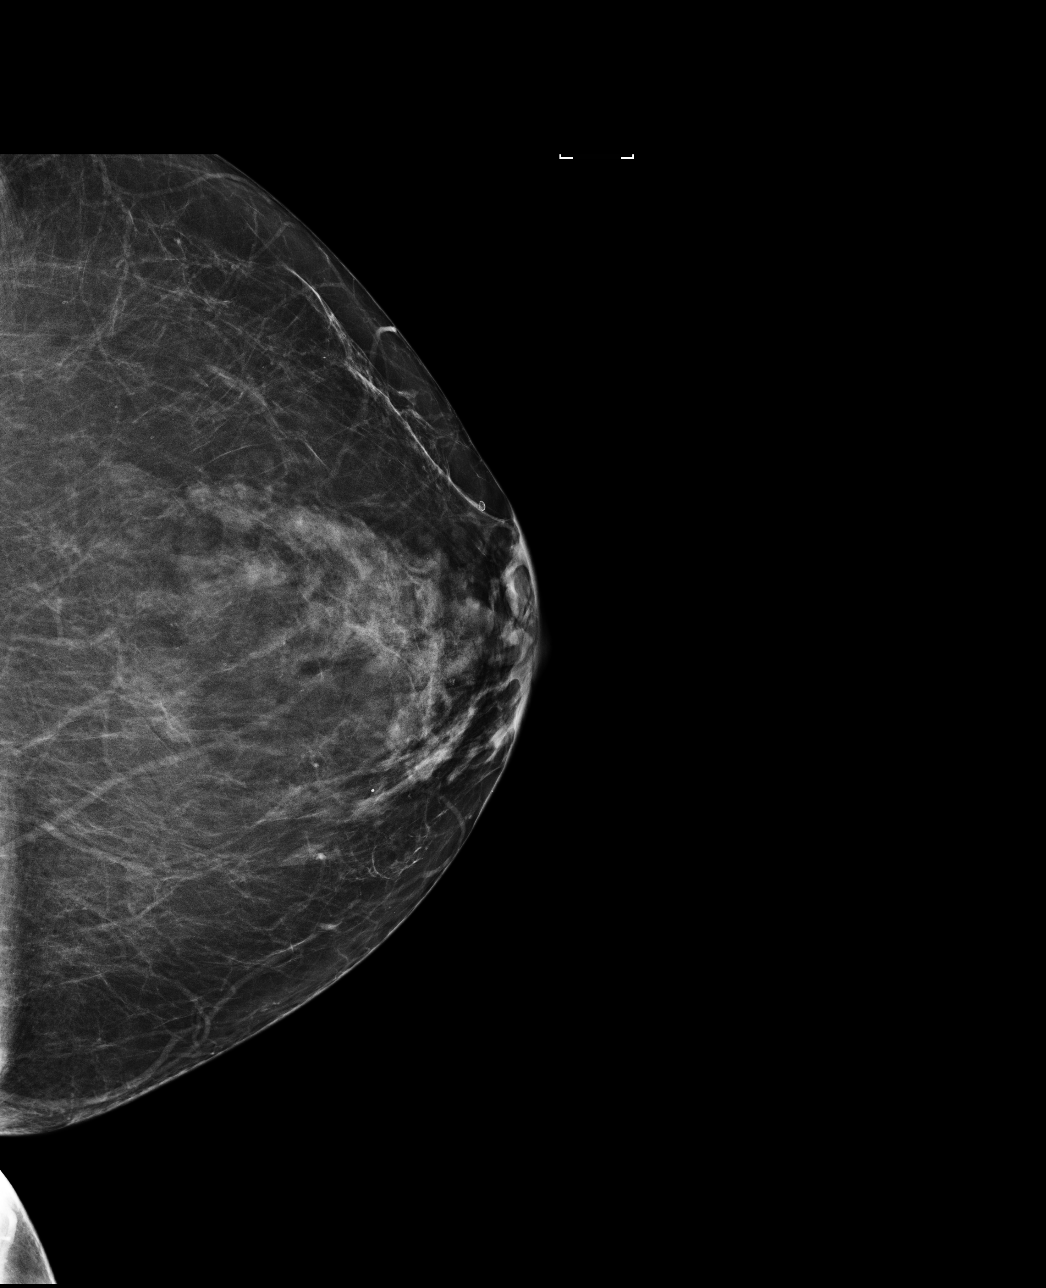

[R MLO]
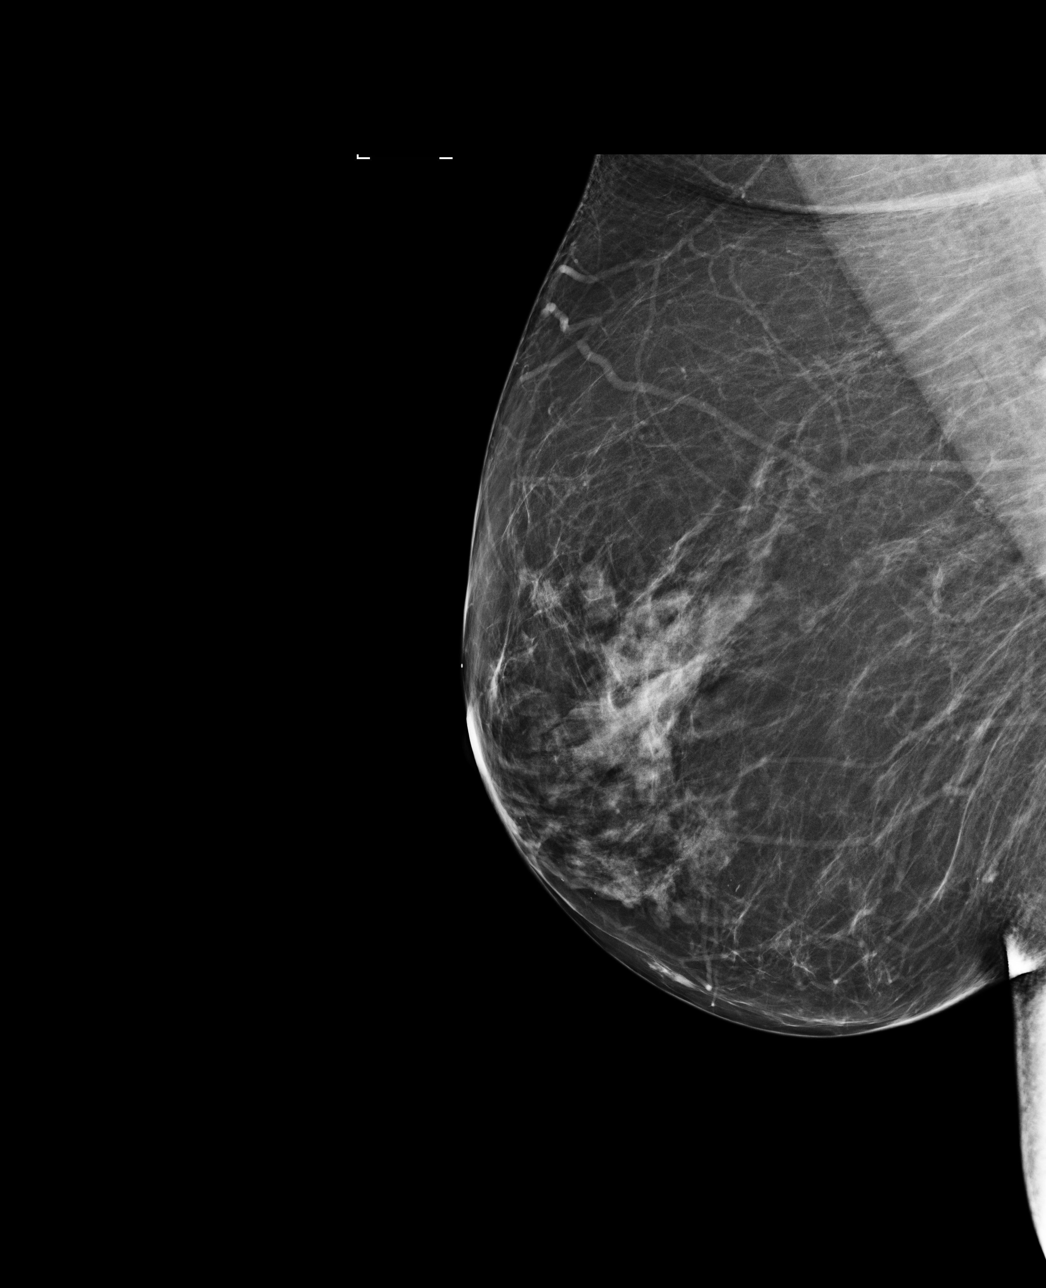

[R CC]
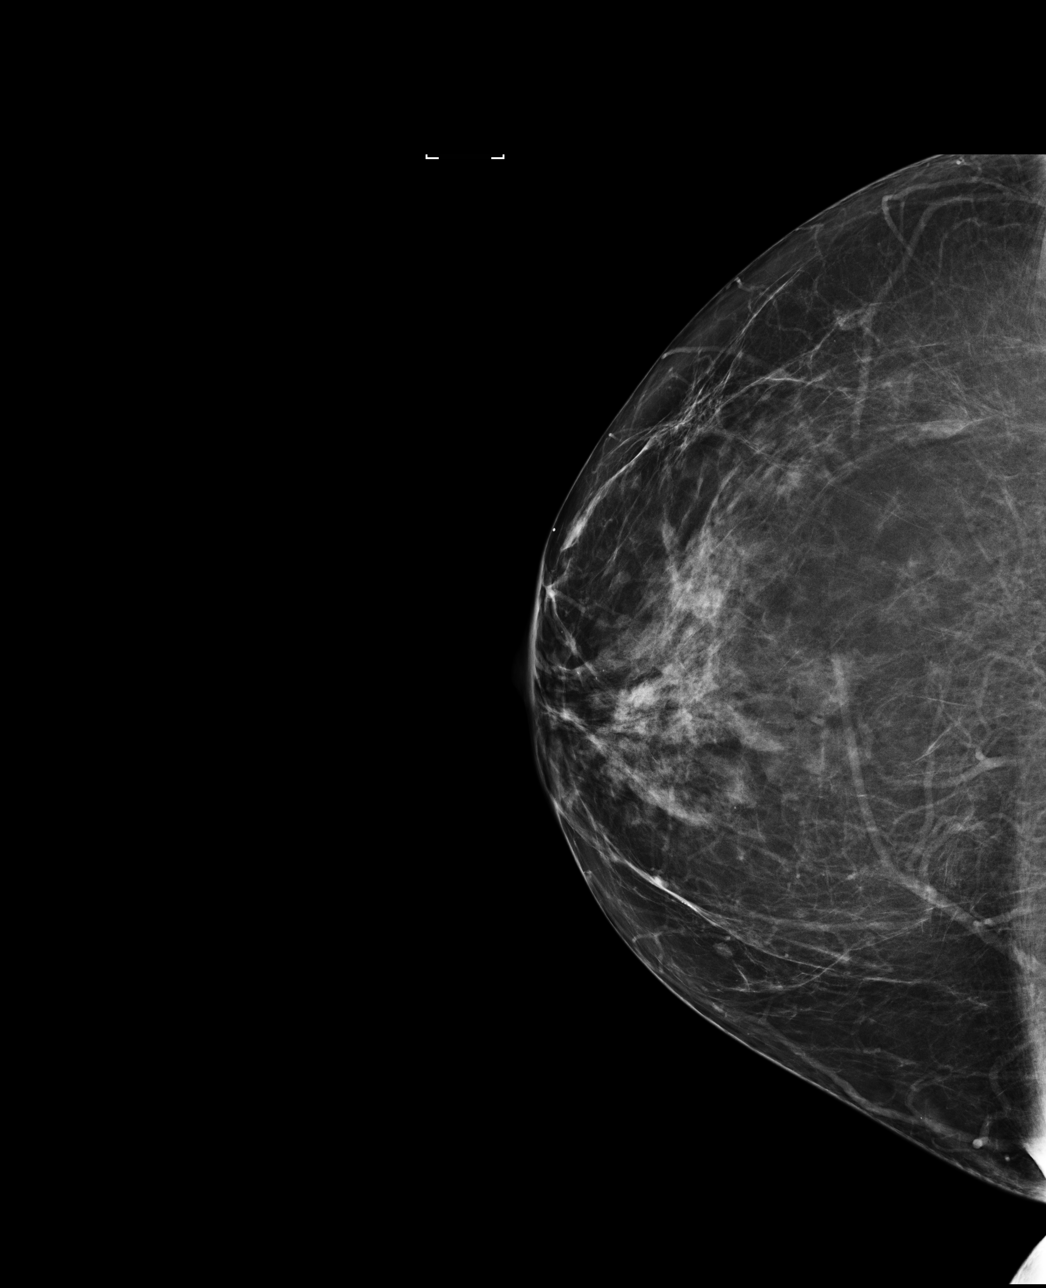

[4 of 4 positions shown; findings below may reference images not displayed]

ACR Breast Density Category b: There are scattered areas of
fibroglandular density.
FINDINGS: There are no findings suspicious for malignancy. Images were
processed with CAD.
IMPRESSION: No mammographic evidence of malignancy. A result letter of this
screening mammogram will be mailed directly to the patient.

RECOMMENDATION:
Screening mammogram in one year. (Code:AS-G-LCT)

BI-RADS CATEGORY  1: Negative.

## 2019-11-03 ENCOUNTER — Telehealth: Payer: Self-pay | Admitting: Obstetrics and Gynecology

## 2019-11-03 NOTE — Telephone Encounter (Signed)
Patient asked to talk with a nurse and request a return call tomorrow between 12:30-2:00pm.

## 2019-11-04 NOTE — Telephone Encounter (Signed)
AEX 03/04/2020 PMP per pt, no recent labs H/O HSV 2  Spoke with pt. Pt states having worsening hot flashes, vaginal dryness, brain fog and night sweats for past 2-3 months. Pt states thought it should have went away by now since PMP. Pt states having spotting after SA only x 1 month ago. Denies any other vaginal bleeding pr pain. Pt denies using any OTC hormone therapy.   Pt states these symptoms have become hard to function at work, especially forgetting things.  Pt not currently SA. Has new partner and thinking about coming SA again.  Pt requesting to have OV for HRT options/consult.  Pt scheduled for 12/11/19 at 10 am with Dr Edward Jolly per pt's request due to work schedule.  Pt agreeable and verbalized understanding to date and time of appt. Pt will return call to office if needing earlier appt.   Routing to Dr Edward Jolly for review. Encounter closed.

## 2019-11-18 ENCOUNTER — Other Ambulatory Visit: Payer: Self-pay | Admitting: Obstetrics and Gynecology

## 2019-11-18 NOTE — Telephone Encounter (Signed)
Medication refill request: Valtrex  Last AEX:  11/11/18  Next AEX: 12/11/19 Last MMG (if hormonal medication request): NA Refill authorized: #[id number] with 3 rf

## 2019-11-24 ENCOUNTER — Telehealth: Payer: Self-pay | Admitting: Obstetrics and Gynecology

## 2019-11-24 NOTE — Telephone Encounter (Signed)
Patient would like to know if she can get on going refills on valacyclovir. Ok to leave a detailed message if she is not available.

## 2019-11-24 NOTE — Telephone Encounter (Signed)
I recommend she take Valtrex 500 mg daily for prevention and then increase to 500 mg po bid x 3 days for an outbreak. Dispense:  30 RF:  none.   Of note, her hCG was just barely positive when she went to the ER in January.  I do not see any follow up of this.  I recommend she have a quantitative hCG drawn to understand this better. Hcg levels increase in pregnancy and with some rare gynecologic conditions.

## 2019-11-24 NOTE — Telephone Encounter (Signed)
Spoke with patient. Hx of HSV 2. Rx for Valtrex sent on 11/18/19 for 30 tabs. Patient is requesting additional refills until her next AEX, is concerned she will run out of medication. Patient reports she takes Valacyclovir 1000 mg tab PO daily, increases to bid with outbreaks. Denies any increase in outbreaks. Confirmed pharmacy on file. Advised patient I will review request with Dr. Edward Jolly and f/u with recommendations. Patient agreeable.   Patient is scheduled for OV on 7/29 and AEX on 03/04/20.   Dr. Edward Jolly -please advise on refills of Valtrex until next AEX.

## 2019-11-25 MED ORDER — VALACYCLOVIR HCL 500 MG PO TABS: 500.0000 mg | ORAL_TABLET | Freq: Every day | ORAL | 0 refills | Status: DC

## 2019-11-25 NOTE — Telephone Encounter (Signed)
Spoke with patient, advised as seen below per Dr. Edward Jolly.   Patient will complete current Rx, she will take 1/2 of the 1000 mg tab (500 mg) PO daily until Rx completed. Patient request pharmacy hold Rx until requested. Rx sent to verified pharmacy with instructions. Patient read back Rx instructions.   Patient declines lab appt at this time for repeat hCG, states she will have this done when she is in the office on 7/29, will plan to further discuss with Dr. Edward Jolly. Patient is aware to return call to office if she desires lab appt earlier. Questions answered. Patient thankful for f/u.   Routing to provider for final review. Patient is agreeable to disposition. Will close encounter.

## 2019-12-02 ENCOUNTER — Other Ambulatory Visit: Payer: Self-pay

## 2019-12-02 DIAGNOSIS — N9089 Other specified noninflammatory disorders of vulva and perineum: Secondary | ICD-10-CM

## 2019-12-02 NOTE — Telephone Encounter (Signed)
Medication refill request: Valacyclovir HCL 1gm  Last AEX:  11/11/18 Next OV: 12/11/19 Next AEX: 03/04/20 Last MMG (if hormonal medication request): 11/19/17 BIRADS 1 negative/density b. Refill authorized: #30/0 refills

## 2019-12-03 MED ORDER — VALACYCLOVIR HCL 1 G PO TABS: 1000.0000 mg | ORAL_TABLET | Freq: Every day | ORAL | 0 refills | Status: DC

## 2019-12-10 NOTE — Progress Notes (Signed)
GYNECOLOGY  VISIT   HPI: 59 y.o.   Single  Caucasian  female   G2P0002 with No LMP recorded (lmp unknown). Patient is perimenopausal.   here for HRT consult.   Having terrible hot flashes during the day and the night.  Daytime hot flashes is more of an issue. Struggling through things.  The hot flashes are occurring during her work. Has not tried anything.   Some short term memory issues for a while.  Has chronic pain in her shoulders.   LMP 05/15/2016.   Prior hormone testing at [facility].  FSH 66.1 and E2 9.5.  Needs refill of Valtrex.  She has been taking Valtrex 1000 mg daily. She does not want to run out.   Vaccinated against Covid.  She did have Covid this year.  When she went tot he hospital on 05/19/19, she had a positive serum hCG test measuring 6.4. She did not have any follow up.   GYNECOLOGIC HISTORY: No LMP recorded (lmp unknown). Patient is perimenopausal. Contraception:  none Menopausal hormone therapy:  none Last mammogram:  12/30/18 BIRADS 1 negative/density b Last pap smear:   11/11/18 Neg:Neg HR HPV        OB History    Gravida  2   Para  2   Term  0   Preterm  0   AB  0   Living  2     SAB  0   TAB  0   Ectopic  0   Multiple  0   Live Births  0              Patient Active Problem List   Diagnosis Date Noted   Stress incontinence 09/29/2017   Menopausal symptoms 09/29/2017    Past Medical History:  Diagnosis Date   Anxiety    Depression    Fibromyalgia    Herpes simplex type 2 infection 2020   Hormone disorder    Hyperlipidemia    Infertility, female    Lactose intolerance    Ovarian hyperstimulation syndrome    PCOS (polycystic ovarian syndrome)    Urinary incontinence     Past Surgical History:  Procedure Laterality Date   ABDOMINAL SURGERY  2004   BREAST REDUCTION SURGERY Bilateral 2004   COLPOSCOPY     COSMETIC SURGERY     DILATION AND CURETTAGE OF UTERUS     FERTILITY SURGERY      OOPHORECTOMY     REDUCTION MAMMAPLASTY Bilateral     Current Outpatient Medications  Medication Sig Dispense Refill   busPIRone (BUSPAR) 15 MG tablet Take 1 tablet by mouth 2 (two) times daily.     DULoxetine (CYMBALTA) 30 MG capsule Take by mouth.     valACYclovir (VALTREX) 1000 MG tablet Take 1 tablet (1,000 mg total) by mouth daily. Take 1 tablet by mouth daily. 30 tablet 0   lidocaine (XYLOCAINE) 5 % ointment Apply 1 application topically 3 (three) times daily. (Patient not taking: Reported on 12/11/2019) 1.25 g 1   No current facility-administered medications for this visit.     ALLERGIES: Patient has no known allergies.  Family History  Problem Relation Age of Onset   Depression Mother    Pancreatic cancer Mother    Diabetes Mother    Obesity Mother    Heart Problems Mother    Depression Father    Cancer Maternal Grandmother    Dementia Maternal Grandmother    Breast cancer Maternal Grandmother    ADD /  ADHD Daughter     Social History   Socioeconomic History   Marital status: Single    Spouse name: Not on file   Number of children: Not on file   Years of education: Not on file   Highest education level: Not on file  Occupational History   Not on file  Tobacco Use   Smoking status: Never Smoker   Smokeless tobacco: Never Used  Vaping Use   Vaping Use: Never used  Substance and Sexual Activity   Alcohol use: Yes    Comment: occasional   Drug use: Never   Sexual activity: Yes    Birth control/protection: Post-menopausal  Other Topics Concern   Not on file  Social History Narrative   Not on file   Social Determinants of Health   Financial Resource Strain:    Difficulty of Paying Living Expenses:   Food Insecurity:    Worried About Programme researcher, broadcasting/film/video in the Last Year:    Barista in the Last Year:   Transportation Needs:    Freight forwarder (Medical):    Lack of Transportation (Non-Medical):   Physical  Activity:    Days of Exercise per Week:    Minutes of Exercise per Session:   Stress:    Feeling of Stress :   Social Connections:    Frequency of Communication with Friends and Family:    Frequency of Social Gatherings with Friends and Family:    Attends Religious Services:    Active Member of Clubs or Organizations:    Attends Engineer, structural:    Marital Status:   Intimate Partner Violence:    Fear of Current or Ex-Partner:    Emotionally Abused:    Physically Abused:    Sexually Abused:     Review of Systems  Constitutional: Negative.   HENT: Negative.   Eyes: Negative.   Respiratory: Negative.   Cardiovascular: Negative.   Gastrointestinal: Negative.   Endocrine: Negative.   Genitourinary: Negative.   Musculoskeletal: Negative.   Skin: Negative.   Allergic/Immunologic: Negative.   Neurological: Negative.   Hematological: Negative.   Psychiatric/Behavioral: Negative.     PHYSICAL EXAMINATION:    BP 112/68 (BP Location: Right Arm, Patient Position: Sitting, Cuff Size: Normal)    Pulse 88    Resp 14    Ht 5\' 4"  (1.626 m)    Wt 176 lb (79.8 kg)    LMP  (LMP Unknown)    BMI 30.21 kg/m     General appearance: alert, cooperative and appears stated age  ASSESSMENT  Menopausal symptoms.  Depression and anxiety.  On Cymbalta and Buspar.  Memory change.  Elevated hCG. HSV.  PLAN  She will start Gabapentin 100 mg po bid. We also talked about Effexor and Paxil in addition to HRT - risks and benefits.  Since she is already on Cymbalta and Buspar, her prescriber would need to make modifications in her usually depression/anxiety meds. Discussion of menopausal symptoms and health changes that accompany menopause. Brochure on menopause.  Will check hCG level.  She will see her PCP regarding her memory change and proceeding with a proper evaluation which may include a visit to a neurologist.  We lowered her daily dosage of Valtrex to 500 mg daily  with increase to 500 mg po bid x 3 days for an outbreak.  She is comfortable with this protocol now. Questions invited and answered.    42 minute consultation.

## 2019-12-11 ENCOUNTER — Ambulatory Visit: Payer: BC Managed Care – PPO | Admitting: Obstetrics and Gynecology

## 2019-12-11 ENCOUNTER — Other Ambulatory Visit: Payer: Self-pay

## 2019-12-11 ENCOUNTER — Encounter: Payer: Self-pay | Admitting: Obstetrics and Gynecology

## 2019-12-11 VITALS — BP 112/68 | HR 88 | Resp 14 | Ht 64.0 in | Wt 176.0 lb

## 2019-12-11 DIAGNOSIS — Z5181 Encounter for therapeutic drug level monitoring: Secondary | ICD-10-CM

## 2019-12-11 DIAGNOSIS — N951 Menopausal and female climacteric states: Secondary | ICD-10-CM | POA: Diagnosis not present

## 2019-12-11 DIAGNOSIS — E349 Endocrine disorder, unspecified: Secondary | ICD-10-CM

## 2019-12-11 MED ORDER — GABAPENTIN 100 MG PO CAPS: 100.0000 mg | ORAL_CAPSULE | Freq: Two times a day (BID) | ORAL | 1 refills | Status: DC

## 2019-12-11 NOTE — Patient Instructions (Signed)
Gabapentin capsules or tablets What is this medicine? GABAPENTIN (GA ba pen tin) is used to control seizures in certain types of epilepsy. It is also used to treat certain types of nerve pain. This medicine may be used for other purposes; ask your health care provider or pharmacist if you have questions. COMMON BRAND NAME(S): Active-PAC with Gabapentin, Gabarone, Neurontin What should I tell my health care provider before I take this medicine? They need to know if you have any of these conditions:  history of drug abuse or alcohol abuse problem  kidney disease  lung or breathing disease  suicidal thoughts, plans, or attempt; a previous suicide attempt by you or a family member  an unusual or allergic reaction to gabapentin, other medicines, foods, dyes, or preservatives  pregnant or trying to get pregnant  breast-feeding How should I use this medicine? Take this medicine by mouth with a glass of water. Follow the directions on the prescription label. You can take it with or without food. If it upsets your stomach, take it with food. Take your medicine at regular intervals. Do not take it more often than directed. Do not stop taking except on your doctor's advice. If you are directed to break the 600 or 800 mg tablets in half as part of your dose, the extra half tablet should be used for the next dose. If you have not used the extra half tablet within 28 days, it should be thrown away. A special MedGuide will be given to you by the pharmacist with each prescription and refill. Be sure to read this information carefully each time. Talk to your pediatrician regarding the use of this medicine in children. While this drug may be prescribed for children as young as 3 years for selected conditions, precautions do apply. Overdosage: If you think you have taken too much of this medicine contact a poison control center or emergency room at once. NOTE: This medicine is only for you. Do not share this  medicine with others. What if I miss a dose? If you miss a dose, take it as soon as you can. If it is almost time for your next dose, take only that dose. Do not take double or extra doses. What may interact with this medicine? This medicine may interact with the following medications:  alcohol  antihistamines for allergy, cough, and cold  certain medicines for anxiety or sleep  certain medicines for depression like amitriptyline, fluoxetine, sertraline  certain medicines for seizures like phenobarbital, primidone  certain medicines for stomach problems  general anesthetics like halothane, isoflurane, methoxyflurane, propofol  local anesthetics like lidocaine, pramoxine, tetracaine  medicines that relax muscles for surgery  narcotic medicines for pain  phenothiazines like chlorpromazine, mesoridazine, prochlorperazine, thioridazine This list may not describe all possible interactions. Give your health care provider a list of all the medicines, herbs, non-prescription drugs, or dietary supplements you use. Also tell them if you smoke, drink alcohol, or use illegal drugs. Some items may interact with your medicine. What should I watch for while using this medicine? Visit your doctor or health care provider for regular checks on your progress. You may want to keep a record at home of how you feel your condition is responding to treatment. You may want to share this information with your doctor or health care provider at each visit. You should contact your doctor or health care provider if your seizures get worse or if you have any new types of seizures. Do not stop taking   this medicine or any of your seizure medicines unless instructed by your doctor or health care provider. Stopping your medicine suddenly can increase your seizures or their severity. This medicine may cause serious skin reactions. They can happen weeks to months after starting the medicine. Contact your health care  provider right away if you notice fevers or flu-like symptoms with a rash. The rash may be red or purple and then turn into blisters or peeling of the skin. Or, you might notice a red rash with swelling of the face, lips or lymph nodes in your neck or under your arms. Wear a medical identification bracelet or chain if you are taking this medicine for seizures, and carry a card that lists all your medications. You may get drowsy, dizzy, or have blurred vision. Do not drive, use machinery, or do anything that needs mental alertness until you know how this medicine affects you. To reduce dizzy or fainting spells, do not sit or stand up quickly, especially if you are an older patient. Alcohol can increase drowsiness and dizziness. Avoid alcoholic drinks. Your mouth may get dry. Chewing sugarless gum or sucking hard candy, and drinking plenty of water will help. The use of this medicine may increase the chance of suicidal thoughts or actions. Pay special attention to how you are responding while on this medicine. Any worsening of mood, or thoughts of suicide or dying should be reported to your health care provider right away. Women who become pregnant while using this medicine may enroll in the [place] [employer] Antiepileptic Drug Pregnancy Registry by calling [phone]. This registry collects information about the safety of antiepileptic drug use during pregnancy. What side effects may I notice from receiving this medicine? Side effects that you should report to your doctor or health care professional as soon as possible:  allergic reactions like skin rash, itching or hives, swelling of the face, lips, or tongue  breathing problems  rash, fever, and swollen lymph nodes  redness, blistering, peeling or loosening of the skin, including inside the mouth  suicidal thoughts, mood changes Side effects that usually do not require medical attention (report to your doctor or health care professional if they  continue or are bothersome):  dizziness  drowsiness  headache  nausea, vomiting  swelling of ankles, feet, hands  tiredness This list may not describe all possible side effects. Call your doctor for medical advice about side effects. You may report side effects to FDA at 1-800-FDA-1088. Where should I keep my medicine? Keep out of reach of children. This medicine may cause accidental overdose and death if it taken by other adults, children, or pets. Mix any unused medicine with a substance like cat litter or coffee grounds. Then throw the medicine away in a sealed container like a sealed bag or a coffee can with a lid. Do not use the medicine after the expiration date. Store at room temperature between 15 and 30 degrees C (59 and 86 degrees F). NOTE: This sheet is a summary. It may not cover all possible information. If you have questions about this medicine, talk to your doctor, pharmacist, or health care provider.  2020 [employer]vier/Gold Standard (2018-08-02 14:16:43) Paroxetine tablets What is this medicine? PAROXETINE (pa ROX e teen) is used to treat depression. It may also be used to treat anxiety disorders, obsessive compulsive disorder, panic attacks, post traumatic stress, and premenstrual dysphoric disorder (PMDD). This medicine may be used for other purposes; ask your health care provider or pharmacist if you have  questions. COMMON BRAND NAME(S): Paxil, Pexeva What should I tell my health care provider before I take this medicine? They need to know if you have any of these conditions:  bipolar disorder or a family history of bipolar disorder  bleeding disorders  glaucoma  heart disease  kidney disease  liver disease  low levels of sodium in the blood  seizures  suicidal thoughts, plans, or attempt; a previous suicide attempt by you or a family member  take MAOIs like Carbex, Eldepryl, Marplan, Nardil, and Parnate  take medicines that treat or prevent blood  clots  thyroid disease  an unusual or allergic reaction to paroxetine, other medicines, foods, dyes, or preservatives  pregnant or trying to get pregnant  breast-feeding How should I use this medicine? Take this medicine by mouth with a glass of water. Follow the directions on the prescription label. You can take it with or without food. Take your medicine at regular intervals. Do not take your medicine more often than directed. Do not stop taking this medicine suddenly except upon the advice of your doctor. Stopping this medicine too quickly may cause serious side effects or your condition may worsen. A special MedGuide will be given to you by the pharmacist with each prescription and refill. Be sure to read this information carefully each time. Talk to your pediatrician regarding the use of this medicine in children. Special care may be needed. Overdosage: If you think you have taken too much of this medicine contact a poison control center or emergency room at once. NOTE: This medicine is only for you. Do not share this medicine with others. What if I miss a dose? If you miss a dose, take it as soon as you can. If it is almost time for your next dose, take only that dose. Do not take double or extra doses. What may interact with this medicine? Do not take this medicine with any of the following medications:  linezolid  MAOIs like Carbex, Eldepryl, Marplan, Nardil, and Parnate  methylene blue (injected into a vein)  pimozide  thioridazine This medicine may also interact with the following medications:  alcohol  amphetamines  aspirin and aspirin-like medicines  atomoxetine  certain medicines for depression, anxiety, or psychotic disturbances  certain medicines for irregular heart beat like propafenone, flecainide, encainide, and quinidine  certain medicines for migraine headache like almotriptan, eletriptan, frovatriptan, naratriptan, rizatriptan, sumatriptan,  zolmitriptan  cimetidine  digoxin  diuretics  fentanyl  fosamprenavir  furazolidone  isoniazid  lithium  medicines that treat or prevent blood clots like warfarin, enoxaparin, and dalteparin  medicines for sleep  NSAIDs, medicines for pain and inflammation, like ibuprofen or naproxen  phenobarbital  phenytoin  procarbazine  rasagiline  ritonavir  supplements like St. John's wort, kava kava, valerian  tamoxifen  tramadol  tryptophan This list may not describe all possible interactions. Give your health care provider a list of all the medicines, herbs, non-prescription drugs, or dietary supplements you use. Also tell them if you smoke, drink alcohol, or use illegal drugs. Some items may interact with your medicine. What should I watch for while using this medicine? Tell your doctor if your symptoms do not get better or if they get worse. Visit your doctor or health care professional for regular checks on your progress. Because it may take several weeks to see the full effects of this medicine, it is important to continue your treatment as prescribed by your doctor. Patients and their families should watch out for new  or worsening thoughts of suicide or depression. Also watch out for sudden changes in feelings such as feeling anxious, agitated, panicky, irritable, hostile, aggressive, impulsive, severely restless, overly excited and hyperactive, or not being able to sleep. If this happens, especially at the beginning of treatment or after a change in dose, call your health care professional. Bonita QuinYou may get drowsy or dizzy. Do not drive, use machinery, or do anything that needs mental alertness until you know how this medicine affects you. Do not stand or sit up quickly, especially if you are an older patient. This reduces the risk of dizzy or fainting spells. Alcohol may interfere with the effect of this medicine. Avoid alcoholic drinks. Your mouth may get dry. Chewing  sugarless gum or sucking hard candy, and drinking plenty of water will help. Contact your doctor if the problem does not go away or is severe. What side effects may I notice from receiving this medicine? Side effects that you should report to your doctor or health care professional as soon as possible:  allergic reactions like skin rash, itching or hives, swelling of the face, lips, or tongue  anxious  black, tarry stools  changes in vision  confusion  elevated mood, decreased need for sleep, racing thoughts, impulsive behavior  eye pain  fast, irregular heartbeat  feeling faint or lightheaded, falls  feeling agitated, angry, or irritable  hallucination, loss of contact with reality  loss of balance or coordination  loss of memory  painful or prolonged erections  restlessness, pacing, inability to keep still  seizures  stiff muscles  suicidal thoughts or other mood changes  trouble sleeping  unusual bleeding or bruising  unusually weak or tired  vomiting Side effects that usually do not require medical attention (report to your doctor or health care professional if they continue or are bothersome):  change in appetite or weight  change in sex drive or performance  diarrhea  dizziness  dry mouth  increased sweating  indigestion, nausea  tired  tremors This list may not describe all possible side effects. Call your doctor for medical advice about side effects. You may report side effects to FDA at 1-800-FDA-1088. Where should I keep my medicine? Keep out of the reach of children. Store at room temperature between 15 and 30 degrees C (59 and 86 degrees F). Keep container tightly closed. Throw away any unused medicine after the expiration date. NOTE: This sheet is a summary. It may not cover all possible information. If you have questions about this medicine, talk to your doctor, pharmacist, or health care provider.  2020 Elsevier/Gold Standard  (2015-10-02 15:50:32) Venlafaxine extended-release capsules What is this medicine? VENLAFAXINE(VEN la fax een) is used to treat depression, anxiety and panic disorder. This medicine may be used for other purposes; ask your health care provider or pharmacist if you have questions. COMMON BRAND NAME(S): Effexor XR What should I tell my health care provider before I take this medicine? They need to know if you have any of these conditions:  bleeding disorders  glaucoma  heart disease  high blood pressure  high cholesterol  kidney disease  liver disease  low levels of sodium in the blood  mania or bipolar disorder  seizures  suicidal thoughts, plans, or attempt; a previous suicide attempt by you or a family  take medicines that treat or prevent blood clots  thyroid disease  an unusual or allergic reaction to venlafaxine, desvenlafaxine, other medicines, foods, dyes, or preservatives  pregnant or trying to  get pregnant  breast-feeding How should I use this medicine? Take this medicine by mouth with a full glass of water. Follow the directions on the prescription label. Do not cut, crush, or chew this medicine. Take it with food. If needed, the capsule may be carefully opened and the entire contents sprinkled on a spoonful of cool applesauce. Swallow the applesauce/pellet mixture right away without chewing and follow with a glass of water to ensure complete swallowing of the pellets. Try to take your medicine at about the same time each day. Do not take your medicine more often than directed. Do not stop taking this medicine suddenly except upon the advice of your doctor. Stopping this medicine too quickly may cause serious side effects or your condition may worsen. A special MedGuide will be given to you by the pharmacist with each prescription and refill. Be sure to read this information carefully each time. Talk to your pediatrician regarding the use of this medicine in  children. Special care may be needed. Overdosage: If you think you have taken too much of this medicine contact a poison control center or emergency room at once. NOTE: This medicine is only for you. Do not share this medicine with others. What if I miss a dose? If you miss a dose, take it as soon as you can. If it is almost time for your next dose, take only that dose. Do not take double or extra doses. What may interact with this medicine? Do not take this medicine with any of the following medications:  certain medicines for fungal infections like fluconazole, itraconazole, ketoconazole, posaconazole, voriconazole  cisapride  desvenlafaxine  dronedarone  duloxetine  levomilnacipran  linezolid  MAOIs like Carbex, Eldepryl, Marplan, Nardil, and Parnate  methylene blue (injected into a vein)  milnacipran  pimozide  thioridazine This medicine may also interact with the following medications:  amphetamines  aspirin and aspirin-like medicines  certain medicines for depression, anxiety, or psychotic disturbances  certain medicines for migraine headaches like almotriptan, eletriptan, frovatriptan, naratriptan, rizatriptan, sumatriptan, zolmitriptan  certain medicines for sleep  certain medicines that treat or prevent blood clots like dalteparin, enoxaparin, warfarin  cimetidine  clozapine  diuretics  fentanyl  furazolidone  indinavir  isoniazid  lithium  metoprolol  NSAIDS, medicines for pain and inflammation, like ibuprofen or naproxen  other medicines that prolong the QT interval (cause an abnormal heart rhythm) like dofetilide, ziprasidone  procarbazine  rasagiline  supplements like St. John's wort, kava kava, valerian  tramadol  tryptophan This list may not describe all possible interactions. Give your health care provider a list of all the medicines, herbs, non-prescription drugs, or dietary supplements you use. Also tell them if you smoke,  drink alcohol, or use illegal drugs. Some items may interact with your medicine. What should I watch for while using this medicine? Tell your doctor if your symptoms do not get better or if they get worse. Visit your doctor or health care professional for regular checks on your progress. Because it may take several weeks to see the full effects of this medicine, it is important to continue your treatment as prescribed by your doctor. Patients and their families should watch out for new or worsening thoughts of suicide or depression. Also watch out for sudden changes in feelings such as feeling anxious, agitated, panicky, irritable, hostile, aggressive, impulsive, severely restless, overly excited and hyperactive, or not being able to sleep. If this happens, especially at the beginning of treatment or after a change in  dose, call your health care professional. This medicine can cause an increase in blood pressure. Check with your doctor for instructions on monitoring your blood pressure while taking this medicine. You may get drowsy or dizzy. Do not drive, use machinery, or do anything that needs mental alertness until you know how this medicine affects you. Do not stand or sit up quickly, especially if you are an older patient. This reduces the risk of dizzy or fainting spells. Alcohol may interfere with the effect of this medicine. Avoid alcoholic drinks. Your mouth may get dry. Chewing sugarless gum, sucking hard candy and drinking plenty of water will help. Contact your doctor if the problem does not go away or is severe. What side effects may I notice from receiving this medicine? Side effects that you should report to your doctor or health care professional as soon as possible:  allergic reactions like skin rash, itching or hives, swelling of the face, lips, or tongue  anxious  breathing problems  confusion  changes in vision  chest pain  confusion  elevated mood, decreased need for  sleep, racing thoughts, impulsive behavior  eye pain  fast, irregular heartbeat  feeling faint or lightheaded, falls  feeling agitated, angry, or irritable  hallucination, loss of contact with reality  high blood pressure  loss of balance or coordination  palpitations  redness, blistering, peeling or loosening of the skin, including inside the mouth  restlessness, pacing, inability to keep still  seizures  stiff muscles  suicidal thoughts or other mood changes  trouble passing urine or change in the amount of urine  trouble sleeping  unusual bleeding or bruising  unusually weak or tired  vomiting Side effects that usually do not require medical attention (report to your doctor or health care professional if they continue or are bothersome):  change in sex drive or performance  change in appetite or weight  constipation  dizziness  dry mouth  headache  increased sweating  nausea  tired This list may not describe all possible side effects. Call your doctor for medical advice about side effects. You may report side effects to FDA at 1-800-FDA-1088. Where should I keep my medicine? Keep out of the reach of children. Store at a controlled temperature between 20 and 25 degrees C (68 degrees and 77 degrees F), in a dry place. Throw away any unused medicine after the expiration date. NOTE: This sheet is a summary. It may not cover all possible information. If you have questions about this medicine, talk to your doctor, pharmacist, or health care provider.  2020 Elsevier/Gold Standard (2018-04-23 12:06:43)

## 2019-12-12 ENCOUNTER — Other Ambulatory Visit: Payer: Self-pay | Admitting: Obstetrics and Gynecology

## 2019-12-12 DIAGNOSIS — Z1231 Encounter for screening mammogram for malignant neoplasm of breast: Secondary | ICD-10-CM

## 2019-12-12 LAB — BETA HCG QUANT (REF LAB): hCG Quant: 4 m[IU]/mL

## 2019-12-27 ENCOUNTER — Other Ambulatory Visit: Payer: Self-pay | Admitting: Obstetrics and Gynecology

## 2019-12-27 DIAGNOSIS — N9089 Other specified noninflammatory disorders of vulva and perineum: Secondary | ICD-10-CM

## 2019-12-29 NOTE — Telephone Encounter (Signed)
Medication refill request: Valacyclovir 1gm Last AEX:  11/11/18 Next AEX: 02/12/20 Last MMG (if hormonal medication request): 12/30/18 Refill authorized: 30/0

## 2020-01-23 ENCOUNTER — Other Ambulatory Visit: Payer: Self-pay | Admitting: Obstetrics and Gynecology

## 2020-01-23 DIAGNOSIS — N9089 Other specified noninflammatory disorders of vulva and perineum: Secondary | ICD-10-CM

## 2020-02-12 ENCOUNTER — Ambulatory Visit
Admission: RE | Admit: 2020-02-12 | Discharge: 2020-02-12 | Disposition: A | Discharge status: Home / Self Care | Payer: BC Managed Care – PPO | Source: Ambulatory Visit | Attending: Obstetrics and Gynecology | Admitting: Obstetrics and Gynecology

## 2020-02-12 ENCOUNTER — Encounter: Payer: Self-pay | Admitting: Obstetrics and Gynecology

## 2020-02-12 ENCOUNTER — Ambulatory Visit (INDEPENDENT_AMBULATORY_CARE_PROVIDER_SITE_OTHER): Payer: BC Managed Care – PPO | Admitting: Obstetrics and Gynecology

## 2020-02-12 ENCOUNTER — Other Ambulatory Visit: Payer: Self-pay

## 2020-02-12 VITALS — BP 122/78 | HR 88 | Resp 14 | Ht 64.25 in | Wt 176.0 lb

## 2020-02-12 DIAGNOSIS — N952 Postmenopausal atrophic vaginitis: Secondary | ICD-10-CM

## 2020-02-12 DIAGNOSIS — Z1231 Encounter for screening mammogram for malignant neoplasm of breast: Secondary | ICD-10-CM

## 2020-02-12 DIAGNOSIS — N951 Menopausal and female climacteric states: Secondary | ICD-10-CM

## 2020-02-12 MED ORDER — ESTRADIOL 10 MCG VA TABS: 1.0000 | ORAL_TABLET | VAGINAL | 2 refills | Status: DC

## 2020-02-12 MED ORDER — ESTRADIOL 0.05 MG/24HR TD PTTW: 1.0000 | MEDICATED_PATCH | TRANSDERMAL | 2 refills | Status: DC

## 2020-02-12 MED ORDER — PROGESTERONE MICRONIZED 100 MG PO CAPS: 100.0000 mg | ORAL_CAPSULE | Freq: Every day | ORAL | 2 refills | Status: DC

## 2020-02-12 NOTE — Patient Instructions (Signed)
You may consider Paxil or Effexor as an antidepressant for hot flashes.

## 2020-02-12 NOTE — Progress Notes (Signed)
GYNECOLOGY  VISIT   HPI: 59 y.o.   Single  Caucasian  female   G2P0002 with Patient's last menstrual period was 05/15/2016 (approximate).   here for 6 week recheck for treatment of menopausal symptoms.  She has been having the hot flashes for years.  She is on gabapentin 100 mg in the am only.  She is not taking it at night.  When she took it at night, she felt the fatigue last during the day as well. She is still having hot flashes during the day and at night.  Her Pajamas still get wet.  She still feels like she wants to jump out of her skin with the heat.   She bleeds vaginally with vaginal sexual contact and not spontaneously.   States hx of infertility.   Some struggle with depression.  Patient will discuss her medication with her psychiatrist.   She is now of Valtrex 500 mg daily.   GYNECOLOGIC HISTORY: Patient's last menstrual period was 05/15/2016 (approximate). Contraception:  none Menopausal hormone therapy:  none Last mammogram:  12/30/18 BIRADS 1 negative/density b Last pap smear:   11/11/18 Neg:Neg HR HPV        OB History    Gravida  2   Para  2   Term  0   Preterm  0   AB  0   Living  2     SAB  0   TAB  0   Ectopic  0   Multiple  0   Live Births  0              Patient Active Problem List   Diagnosis Date Noted  . Stress incontinence 09/29/2017  . Menopausal symptoms 09/29/2017    Past Medical History:  Diagnosis Date  . Anxiety   . Depression   . Fibromyalgia   . Herpes simplex type 2 infection 2020  . Hormone disorder   . Hyperlipidemia   . Infertility, female   . Lactose intolerance   . Ovarian hyperstimulation syndrome   . PCOS (polycystic ovarian syndrome)   . Urinary incontinence     Past Surgical History:  Procedure Laterality Date  . ABDOMINAL SURGERY  2004  . BREAST REDUCTION SURGERY Bilateral 2004  . COLPOSCOPY    . COSMETIC SURGERY    . DILATION AND CURETTAGE OF UTERUS    . FERTILITY SURGERY    .  OOPHORECTOMY    . REDUCTION MAMMAPLASTY Bilateral     Current Outpatient Medications  Medication Sig Dispense Refill  . busPIRone (BUSPAR) 15 MG tablet Take 1 tablet by mouth 2 (two) times daily.    . DULoxetine (CYMBALTA) 30 MG capsule Take by mouth.    . valACYclovir (VALTREX) 500 MG tablet Take 1 tablet (500 mg total) by mouth daily. Take as prevention.  Take one tablet by mouth twice daily for 3 days for an outbreak. 40 tablet 1  . Estradiol (VAGIFEM) 10 MCG TABS vaginal tablet Place 1 tablet (10 mcg total) vaginally 2 (two) times a week. Use every night before bed for two weeks when you first begin this medicine, then after the first two weeks, begin using it twice a week. 18 tablet 2  . estradiol (VIVELLE-DOT) 0.05 MG/24HR patch Place 1 patch (0.05 mg total) onto the skin 2 (two) times a week. 8 patch 2  . lidocaine (XYLOCAINE) 5 % ointment Apply 1 application topically 3 (three) times daily. (Patient not taking: Reported on 12/11/2019) 1.25 g 1  .  progesterone (PROMETRIUM) 100 MG capsule Take 1 capsule (100 mg total) by mouth daily. 30 capsule 2   No current facility-administered medications for this visit.     ALLERGIES: Patient has no known allergies.  Family History  Problem Relation Age of Onset  . Depression Mother   . Pancreatic cancer Mother   . Diabetes Mother   . Obesity Mother   . Heart Problems Mother   . Depression Father   . Cancer Maternal Grandmother   . Dementia Maternal Grandmother   . Breast cancer Maternal Grandmother   . ADD / ADHD Daughter     Social History   Socioeconomic History  . Marital status: Single    Spouse name: Not on file  . Number of children: Not on file  . Years of education: Not on file  . Highest education level: Not on file  Occupational History  . Not on file  Tobacco Use  . Smoking status: Never Smoker  . Smokeless tobacco: Never Used  Vaping Use  . Vaping Use: Never used  Substance and Sexual Activity  . Alcohol use:  Yes    Comment: occasional  . Drug use: Never  . Sexual activity: Yes    Birth control/protection: Post-menopausal  Other Topics Concern  . Not on file  Social History Narrative  . Not on file   Social Determinants of Health   Financial Resource Strain:   . Difficulty of Paying Living Expenses: Not on file  Food Insecurity:   . Worried About Programme researcher, broadcasting/film/video in the Last Year: Not on file  . Ran Out of Food in the Last Year: Not on file  Transportation Needs:   . Lack of Transportation (Medical): Not on file  . Lack of Transportation (Non-Medical): Not on file  Physical Activity:   . Days of Exercise per Week: Not on file  . Minutes of Exercise per Session: Not on file  Stress:   . Feeling of Stress : Not on file  Social Connections:   . Frequency of Communication with Friends and Family: Not on file  . Frequency of Social Gatherings with Friends and Family: Not on file  . Attends Religious Services: Not on file  . Active Member of Clubs or Organizations: Not on file  . Attends Banker Meetings: Not on file  . Marital Status: Not on file  Intimate Partner Violence:   . Fear of Current or Ex-Partner: Not on file  . Emotionally Abused: Not on file  . Physically Abused: Not on file  . Sexually Abused: Not on file    Review of Systems  Constitutional: Negative.   HENT: Negative.   Eyes: Negative.   Respiratory: Negative.   Cardiovascular: Negative.   Gastrointestinal: Negative.   Endocrine: Negative.   Genitourinary: Negative.   Musculoskeletal: Negative.   Skin: Negative.   Allergic/Immunologic: Negative.   Neurological: Negative.   Hematological: Negative.   Psychiatric/Behavioral: Negative.     PHYSICAL EXAMINATION:    BP 122/78 (BP Location: Right Arm, Patient Position: Sitting, Cuff Size: Normal)   Pulse 88   Resp 14   Ht 5' 4.25" (1.632 m)   Wt 176 lb (79.8 kg)   LMP 05/15/2016 (Approximate)   BMI 29.98 kg/m     General appearance:  alert, cooperative and appears stated age  Pelvic: External genitalia:  no lesions              Urethra:  normal appearing urethra with no masses, tenderness  or lesions              Bartholins and Skenes: normal                 Vagina:  atrophy noted.              Cervix: atrophy noted.                Bimanual Exam:  Uterus:  normal size, contour, position, consistency, mobility, non-tender              Adnexa: no mass, fullness, tenderness           Chaperone was present for exam.  ASSESSMENT  Menopausal symptoms.  Atrophy.  PLAN  Stop Neurontin. Will start transdermal estrogen, Prometrium, and vaginal estrogen.  Discused WHI and use of HRT which can increase risk of PE, DVT, MI, stroke and breast cancer.  FU for annual in 3 weeks.

## 2020-02-13 ENCOUNTER — Telehealth: Payer: Self-pay

## 2020-02-13 NOTE — Telephone Encounter (Signed)
Spoke with pt. Pt states calling to make sure we have documented FH of maternal grandmother breast cancer. Pt advised it is documented in Epic record. Pt wanting to ask Dr Edward Jolly if ok to take HRT that was prescribed 02/12/20?  Pt states plans to start HRT as prescribed tomorrow on 02/14/20. Advised will review with Dr Edward Jolly and return call to pt. Pt thankful for call.   Routing to Dr Edward Jolly.

## 2020-02-13 NOTE — Telephone Encounter (Signed)
Patient has a question about her medication

## 2020-02-14 NOTE — Telephone Encounter (Signed)
Ok for her to start the hormone therapy.  Her grandmother is a second degree relative.  We did discuss, however, the potential increased risk of breast cancer with taking hormone therapy. She will need to do her yearly mammogram.

## 2020-02-16 NOTE — Telephone Encounter (Signed)
Left message for pt to return call to triage RN. 

## 2020-02-17 NOTE — Telephone Encounter (Signed)
Patient is returning call.  °

## 2020-02-17 NOTE — Telephone Encounter (Signed)
Left message for pt to return call to triage RN. 

## 2020-02-17 NOTE — Telephone Encounter (Signed)
Spoke with pt. Pt given results and recommendations per Dr Edward Jolly. Pt agreeable and verbalized understanding. Pt states will continue HRT and had screening MMG on 9/30 and Birads 1, Neg. Pt states has yearly screening MMGs and will keep them up to continue HRT.  Advised pt will give update to Dr Edward Jolly. Pt agreeable.   Routing to Dr Edward Jolly for update and review Encounter closed

## 2020-03-03 NOTE — Progress Notes (Signed)
59 y.o. G27P0002 Single Caucasian female here for annual exam.    Taking HRT for 2 weeks.  Does not feel much of a difference yet, but maybe has less hot flashes. Still sweating at night.  Not feeling bad.   Some lower abdominal discomfort.  Long stand constipation.   Received her Covid vaccine.  No flu vaccine to date.   PCP: [facility]  Patient's last menstrual period was 05/15/2016 (approximate).           Sexually active: No The current method of family planning is post menopausal status.    Exercising: Yes.    walks 3-4x/week and weights Smoker:  no  Health Maintenance: Pap:  11-11-18 Neg:Neg HR HPV, 09/27/17 Neg:Neg HR HPV History of abnormal Pap:  Yes, ~2016/2017 per patient, but no treatment done. MMG: 02-12-20 Neg/density C/BiRads1 Colonoscopy: NEVER;Cologuard 11/07/2017 - negative.  BMD: Years ago per patient done due to fibromyalgia TDaP: 09-27-17 Gardasil:   no HIV: 10-11-18 NR Hep C: 10-11-18 Neg Screening Labs:  PCP.   reports that she has never smoked. She has never used smokeless tobacco. She reports current alcohol use. She reports that she does not use drugs.  Past Medical History:  Diagnosis Date  . Anxiety   . Depression   . Fibromyalgia   . Herpes simplex type 2 infection 2020  . Hormone disorder   . Hyperlipidemia   . Infertility, female   . Lactose intolerance   . Ovarian hyperstimulation syndrome   . PCOS (polycystic ovarian syndrome)   . Urinary incontinence     Past Surgical History:  Procedure Laterality Date  . ABDOMINAL SURGERY  2004  . BREAST REDUCTION SURGERY Bilateral 2004  . COLPOSCOPY    . COSMETIC SURGERY    . DILATION AND CURETTAGE OF UTERUS    . FERTILITY SURGERY    . OOPHORECTOMY    . REDUCTION MAMMAPLASTY Bilateral     Current Outpatient Medications  Medication Sig Dispense Refill  . busPIRone (BUSPAR) 15 MG tablet Take 1 tablet by mouth 2 (two) times daily.    . DULoxetine (CYMBALTA) 60 MG capsule Take  1 capsule by mouth daily.    . Estradiol (VAGIFEM) 10 MCG TABS vaginal tablet Place 1 tablet (10 mcg total) vaginally 2 (two) times a week. Use every night before bed for two weeks when you first begin this medicine, then after the first two weeks, begin using it twice a week. 18 tablet 2  . estradiol (VIVELLE-DOT) 0.05 MG/24HR patch Place 1 patch (0.05 mg total) onto the skin 2 (two) times a week. 8 patch 2  . Multiple Vitamin (MULTIVITAMIN) capsule Take 1 capsule by mouth daily.    . progesterone (PROMETRIUM) 100 MG capsule Take 1 capsule (100 mg total) by mouth daily. 30 capsule 2  . valACYclovir (VALTREX) 500 MG tablet Take 1 tablet (500 mg total) by mouth daily. Take as prevention.  Take one tablet by mouth twice daily for 3 days for an outbreak. 40 tablet 1   No current facility-administered medications for this visit.    Family History  Problem Relation Age of Onset  . Depression Mother   . Pancreatic cancer Mother   . Diabetes Mother   . Obesity Mother   . Heart Problems Mother   . Depression Father   . Cancer Maternal Grandmother   . Dementia Maternal Grandmother   . Breast cancer Maternal Grandmother   . ADD / ADHD Daughter     Review of  Systems  All other systems reviewed and are negative.   Exam:   BP 122/76   Pulse 100   Resp 20   Ht 5\' 4"  (1.626 m)   Wt 176 lb (79.8 kg)   LMP 05/15/2016 (Approximate)   BMI 30.21 kg/m     General appearance: alert, cooperative and appears stated age Head: normocephalic, without obvious abnormality, atraumatic Neck: no adenopathy, supple, symmetrical, trachea midline and thyroid normal to inspection and palpation Lungs: clear to auscultation bilaterally Breasts: normal appearance, no masses or tenderness, No nipple retraction or dimpling, No nipple discharge or bleeding, No axillary adenopathy Heart: regular rate and rhythm Abdomen: soft, non-tender; no masses, no organomegaly Extremities: extremities normal, atraumatic, no  cyanosis or edema Skin: skin color, texture, turgor normal. No rashes or lesions Lymph nodes: cervical, supraclavicular, and axillary nodes normal. Neurologic: grossly normal  Pelvic: External genitalia:  no lesions              No abnormal inguinal nodes palpated.              Urethra:  normal appearing urethra with no masses, tenderness or lesions              Bartholins and Skenes: normal                 Vagina: normal appearing vagina with normal color and discharge, no lesions              Cervix: no lesions              Pap taken: No. Bimanual Exam:  Uterus:  normal size, contour, position, consistency, mobility, non-tender              Adnexa: no mass, fullness, tenderness              Rectal exam: Yes.  .  Confirms.              Anus:  normal sphincter tone, no lesions  Chaperone was present for exam.  Assessment:   Well woman visit with normal exam. HRT.  Constipation.   Plan: Mammogram screening discussed. Self breast awareness reviewed. Pap and HR HPV in 2023. Guidelines for Calcium, Vitamin D, regular exercise program including cardiovascular and weight bearing exercise. Refill of HRT, but will increase her Vivelle Dot to 0.1 mg twice weekly.  Continue Prometrium 100 mg q hs.  Refill of Valtrex 500 mg daily. Fasting labs with PCP.  Referral to gastroenterology. Follow up annually and prn.  After visit summary provided.

## 2020-03-04 ENCOUNTER — Other Ambulatory Visit: Payer: Self-pay

## 2020-03-04 ENCOUNTER — Ambulatory Visit: Payer: BC Managed Care – PPO | Admitting: Obstetrics and Gynecology

## 2020-03-04 ENCOUNTER — Encounter: Payer: Self-pay | Admitting: Obstetrics and Gynecology

## 2020-03-04 VITALS — BP 122/76 | HR 100 | Resp 20 | Ht 64.0 in | Wt 176.0 lb

## 2020-03-04 DIAGNOSIS — N9089 Other specified noninflammatory disorders of vulva and perineum: Secondary | ICD-10-CM | POA: Diagnosis not present

## 2020-03-04 DIAGNOSIS — K5900 Constipation, unspecified: Secondary | ICD-10-CM | POA: Diagnosis not present

## 2020-03-04 DIAGNOSIS — Z01419 Encounter for gynecological examination (general) (routine) without abnormal findings: Secondary | ICD-10-CM | POA: Diagnosis not present

## 2020-03-04 MED ORDER — ESTRADIOL 0.1 MG/24HR TD PTTW: 1.0000 | MEDICATED_PATCH | TRANSDERMAL | 11 refills | Status: DC

## 2020-03-04 MED ORDER — PROGESTERONE MICRONIZED 100 MG PO CAPS: 100.0000 mg | ORAL_CAPSULE | Freq: Every day | ORAL | 11 refills | Status: DC

## 2020-03-04 MED ORDER — ESTRADIOL 10 MCG VA TABS: 1.0000 | ORAL_TABLET | VAGINAL | 11 refills | Status: DC

## 2020-03-04 MED ORDER — VALACYCLOVIR HCL 500 MG PO TABS: 500.0000 mg | ORAL_TABLET | Freq: Every day | ORAL | 11 refills | Status: DC

## 2020-03-04 NOTE — Patient Instructions (Signed)

## 2020-03-24 ENCOUNTER — Encounter: Payer: Self-pay | Admitting: Gastroenterology

## 2020-04-19 ENCOUNTER — Other Ambulatory Visit: Payer: Self-pay | Admitting: Physical Medicine and Rehabilitation

## 2020-04-19 DIAGNOSIS — E0789 Other specified disorders of thyroid: Secondary | ICD-10-CM

## 2020-04-19 DIAGNOSIS — E079 Disorder of thyroid, unspecified: Secondary | ICD-10-CM

## 2020-04-20 ENCOUNTER — Telehealth: Payer: Self-pay

## 2020-04-20 NOTE — Telephone Encounter (Signed)
Left message for pt to return call to triage RN. 

## 2020-04-20 NOTE — Telephone Encounter (Signed)
Patient is calling in regards to bleeding while taking HRT medication. Patient states a detailed message can be left or a return call during her lunch break 12 - 1.

## 2020-04-20 NOTE — Telephone Encounter (Signed)
AEX 10/2018 Last OV 03/04/20 H/o vaginal atrophy   Spoke with pt. Pt states having vaginal light spotting x 3 days while wiping. States is intermittent, not wearing pad or pantyliner. States has abd bloating and cramps intermittently as well over past few months. Denies pain, heavy bleeding, clots, fever, chills, vaginal dryness.    Pt states recently increased HRT with Vivelle Dot patch in Oct 2021.  See Epic notes. Pt currently taking Estrace vaginal tablets, Vivelle Dot patch and progesterone as prescribed and wanted to know if is related to vaginal spotting.   Pt states had recent MRI due to neck issues with [facility] and was discovered had thyroid abnormality and has to schedule Thyroid US per Dr Maurice Small.  Pt has hx of IBS and recent bowel issues with constipation and has appt with Dr Orvan Falconer in Jan 2022 for follow up.  Advised will review with Dr Edward Jolly and return call with recommendations and advice. Pt agreeable.   Routing to Dr Edward Jolly.

## 2020-04-20 NOTE — Telephone Encounter (Signed)
Ok to continue to monitor.  Not unusual to have some spotting within the first 3 months of starting HRT.  Please have her let me know if it persists.

## 2020-04-21 NOTE — Telephone Encounter (Signed)
Left message for pt to return call to triage RN. 

## 2020-04-22 ENCOUNTER — Telehealth: Payer: Self-pay

## 2020-04-22 NOTE — Telephone Encounter (Signed)
Patient is returning a call to [name]. Patient states she has been playing phone tag for a couple of days.

## 2020-04-22 NOTE — Telephone Encounter (Signed)
Anna Chavez   RD  04/22/20 3:18 PM Note Patient is returning a call to [place]. Patient states she has been playing phone tag for a couple of days.     Spoke with patient, advised as seen below per Dr. Edward Jolly. Patient verbalizes understanding and is agreeable.   Encounter closed.

## 2020-04-22 NOTE — Telephone Encounter (Signed)
See telephone encounter dated 04/20/20.   Encounter closed.

## 2020-05-13 ENCOUNTER — Other Ambulatory Visit: Payer: BC Managed Care – PPO

## 2020-06-03 ENCOUNTER — Other Ambulatory Visit: Payer: Self-pay

## 2020-06-03 ENCOUNTER — Ambulatory Visit
Admission: RE | Admit: 2020-06-03 | Discharge: 2020-06-03 | Disposition: A | Discharge status: Home / Self Care | Payer: BC Managed Care – PPO | Source: Ambulatory Visit | Attending: Physical Medicine and Rehabilitation | Admitting: Physical Medicine and Rehabilitation

## 2020-06-03 DIAGNOSIS — E079 Disorder of thyroid, unspecified: Secondary | ICD-10-CM

## 2020-06-03 DIAGNOSIS — E0789 Other specified disorders of thyroid: Secondary | ICD-10-CM

## 2020-06-04 ENCOUNTER — Ambulatory Visit: Payer: BC Managed Care – PPO | Admitting: Gastroenterology

## 2020-06-09 ENCOUNTER — Ambulatory Visit: Payer: BC Managed Care – PPO | Admitting: Obstetrics and Gynecology

## 2020-06-11 ENCOUNTER — Encounter: Payer: Self-pay | Admitting: Obstetrics and Gynecology

## 2020-06-11 ENCOUNTER — Ambulatory Visit: Payer: BC Managed Care – PPO | Admitting: Obstetrics and Gynecology

## 2020-06-11 ENCOUNTER — Other Ambulatory Visit (HOSPITAL_COMMUNITY)
Admission: RE | Admit: 2020-06-11 | Discharge: 2020-06-11 | Disposition: A | Discharge status: Home / Self Care | Payer: BC Managed Care – PPO | Source: Ambulatory Visit | Attending: Obstetrics and Gynecology | Admitting: Obstetrics and Gynecology

## 2020-06-11 ENCOUNTER — Other Ambulatory Visit: Payer: Self-pay | Admitting: Obstetrics and Gynecology

## 2020-06-11 ENCOUNTER — Other Ambulatory Visit: Payer: Self-pay

## 2020-06-11 VITALS — BP 134/62 | HR 98 | Resp 16 | Ht 64.0 in | Wt 183.0 lb

## 2020-06-11 DIAGNOSIS — N930 Postcoital and contact bleeding: Secondary | ICD-10-CM | POA: Diagnosis not present

## 2020-06-11 DIAGNOSIS — Z113 Encounter for screening for infections with a predominantly sexual mode of transmission: Secondary | ICD-10-CM | POA: Insufficient documentation

## 2020-06-11 DIAGNOSIS — Z124 Encounter for screening for malignant neoplasm of cervix: Secondary | ICD-10-CM

## 2020-06-11 DIAGNOSIS — Z79890 Hormone replacement therapy (postmenopausal): Secondary | ICD-10-CM

## 2020-06-11 DIAGNOSIS — Z23 Encounter for immunization: Secondary | ICD-10-CM

## 2020-06-11 MED ORDER — PROGESTERONE 200 MG PO CAPS: 200.0000 mg | ORAL_CAPSULE | Freq: Every day | ORAL | 0 refills | Status: DC

## 2020-06-11 MED ORDER — ESTRADIOL 1 MG PO TABS: 1.0000 mg | ORAL_TABLET | Freq: Every day | ORAL | 0 refills | Status: DC

## 2020-06-11 NOTE — Progress Notes (Signed)
GYNECOLOGY  VISIT   HPI: 60 y.o.   Single  Caucasian  female   G2P0002 with Patient's last menstrual period was 05/15/2016 (approximate).   here for   3 month follow up of HRT. Patient still complaining of bleeding with intercourse.   Bleeding with sex does not occur very often.  No longer with that partner.  Using Vagifem twice weekly.   Still feels warm but does not have the same intensity of the heat.  Emotionally feels ok.  Still sweating at night and having nightmares.   Estrogen patch leaving a mark on her skin.   Feeling tired all the time.   GYNECOLOGIC HISTORY: Patient's last menstrual period was 05/15/2016 (approximate). Contraception:  Post menopausal  Menopausal hormone therapy:  vivelle-dot, progesterone Last mammogram:  02-12-20 density C/BIRADS 1 negative  Last pap smear:   11-11-2018 negative, HR HPV negative         OB History    Gravida  2   Para  2   Term  0   Preterm  0   AB  0   Living  2     SAB  0   IAB  0   Ectopic  0   Multiple  0   Live Births  0              Patient Active Problem List   Diagnosis Date Noted  . Stress incontinence 09/29/2017  . Menopausal symptoms 09/29/2017    Past Medical History:  Diagnosis Date  . Anxiety   . Depression   . Fibromyalgia   . Herpes simplex type 2 infection 2020  . Hormone disorder   . Hyperlipidemia   . Infertility, female   . Lactose intolerance   . Ovarian hyperstimulation syndrome   . PCOS (polycystic ovarian syndrome)   . Urinary incontinence     Past Surgical History:  Procedure Laterality Date  . ABDOMINAL SURGERY  2004  . BREAST REDUCTION SURGERY Bilateral 2004  . COLPOSCOPY    . COSMETIC SURGERY    . DILATION AND CURETTAGE OF UTERUS    . FERTILITY SURGERY    . OOPHORECTOMY    . REDUCTION MAMMAPLASTY Bilateral     Current Outpatient Medications  Medication Sig Dispense Refill  . busPIRone (BUSPAR) 15 MG tablet Take 1 tablet by mouth 2 (two) times daily.    .  celecoxib (CELEBREX) 100 MG capsule Take 100 mg by mouth 2 (two) times daily.    . DULoxetine (CYMBALTA) 60 MG capsule Take 1 capsule by mouth daily.    . Estradiol (VAGIFEM) 10 MCG TABS vaginal tablet Place 1 tablet (10 mcg total) vaginally 2 (two) times a week. 8 tablet 11  . estradiol (VIVELLE-DOT) 0.1 MG/24HR patch Place 1 patch (0.1 mg total) onto the skin 2 (two) times a week. 8 patch 11  . methocarbamol (ROBAXIN) 500 MG tablet Take 500 mg by mouth every 8 (eight) hours as needed.    . Multiple Vitamin (MULTIVITAMIN) capsule Take 1 capsule by mouth daily.    . progesterone (PROMETRIUM) 100 MG capsule Take 1 capsule (100 mg total) by mouth daily. 30 capsule 11  . valACYclovir (VALTREX) 500 MG tablet Take 1 tablet (500 mg total) by mouth daily. Take as prevention.  Take one tablet by mouth twice daily for 3 days for an outbreak. 40 tablet 11   No current facility-administered medications for this visit.     ALLERGIES: Patient has no known allergies.  Family History  Problem Relation Age of Onset  . Depression Mother   . Pancreatic cancer Mother   . Diabetes Mother   . Obesity Mother   . Heart Problems Mother   . Depression Father   . Cancer Maternal Grandmother   . Dementia Maternal Grandmother   . Breast cancer Maternal Grandmother   . ADD / ADHD Daughter     Social History   Socioeconomic History  . Marital status: Single    Spouse name: Not on file  . Number of children: Not on file  . Years of education: Not on file  . Highest education level: Not on file  Occupational History  . Not on file  Tobacco Use  . Smoking status: Never Smoker  . Smokeless tobacco: Never Used  Vaping Use  . Vaping Use: Never used  Substance and Sexual Activity  . Alcohol use: Yes    Comment: 2 glasses of wine/month  . Drug use: Never  . Sexual activity: Not Currently    Birth control/protection: Post-menopausal  Other Topics Concern  . Not on file  Social History Narrative  . Not  on file   Social Determinants of Health   Financial Resource Strain: Not on file  Food Insecurity: Not on file  Transportation Needs: Not on file  Physical Activity: Not on file  Stress: Not on file  Social Connections: Not on file  Intimate Partner Violence: Not on file    Review of Systems  Genitourinary:       Bleeding with intercourse  All other systems reviewed and are negative.   PHYSICAL EXAMINATION:    BP 134/62 (BP Location: Right Arm, Patient Position: Sitting, Cuff Size: Large)   Pulse 98   Resp 16   Ht 5\' 4"  (1.626 m)   Wt 183 lb (83 kg)   LMP 05/15/2016 (Approximate)   BMI 31.41 kg/m     General appearance: alert, cooperative and appears stated age  Pelvic: External genitalia:  no lesions              Urethra:  normal appearing urethra with no masses, tenderness or lesions              Bartholins and Skenes: normal                 Vagina: normal appearing vagina with normal color and discharge, strawberry red areas of the vagina.              Cervix: no lesions                Bimanual Exam:  Uterus:  normal size, contour, position, consistency, mobility, non-tender              Adnexa: no mass, fullness, tenderness           Chaperone was present for exam.  ASSESSMENT  HRT.  Menopausal symptoms not controlled on current regimen.  Post coital bleeding.  Cervical cancer screening.  Hx HSV.   PLAN  Finish current Rx for Vivelle Dot 0.1 mg twice weekly and Prometrium 100 mg nightly.  Will then switch to Estrace 1 mg daily and Prometrium 200 mg nightly.  Pap with reflex HR HPV testing.  STD screening.  Condoms recommended.  Use water based lubricant.  Fu prn.   31 min  total time was spent for this patient encounter, including preparation, face-to-face counseling with the patient, coordination of care, and documentation of the encounter.

## 2020-06-14 LAB — HEPATITIS B SURFACE ANTIGEN: Hepatitis B Surface Ag: NONREACTIVE

## 2020-06-14 LAB — HIV ANTIBODY (ROUTINE TESTING W REFLEX): HIV 1&2 Ab, 4th Generation: NONREACTIVE

## 2020-06-14 LAB — RPR: RPR Ser Ql: NONREACTIVE

## 2020-06-14 LAB — HEPATITIS C ANTIBODY
Hepatitis C Ab: NONREACTIVE
SIGNAL TO CUT-OFF: 0.01 (ref <1.00)

## 2020-06-17 LAB — CYTOLOGY - PAP
Chlamydia: NEGATIVE
Comment: NEGATIVE
Comment: NEGATIVE
Comment: NEGATIVE
Comment: NORMAL
Diagnosis: UNDETERMINED — AB
High risk HPV: POSITIVE — AB
Neisseria Gonorrhea: NEGATIVE
Trichomonas: NEGATIVE

## 2020-06-22 ENCOUNTER — Other Ambulatory Visit: Payer: Self-pay

## 2020-06-22 DIAGNOSIS — R87810 Cervical high risk human papillomavirus (HPV) DNA test positive: Secondary | ICD-10-CM

## 2020-06-22 DIAGNOSIS — R87610 Atypical squamous cells of undetermined significance on cytologic smear of cervix (ASC-US): Secondary | ICD-10-CM

## 2020-08-19 ENCOUNTER — Other Ambulatory Visit: Payer: Self-pay

## 2020-08-19 ENCOUNTER — Other Ambulatory Visit (HOSPITAL_COMMUNITY)
Admission: RE | Admit: 2020-08-19 | Discharge: 2020-08-19 | Disposition: A | Discharge status: Home / Self Care | Payer: BC Managed Care – PPO | Source: Ambulatory Visit | Attending: Obstetrics and Gynecology | Admitting: Obstetrics and Gynecology

## 2020-08-19 ENCOUNTER — Encounter: Payer: Self-pay | Admitting: Obstetrics and Gynecology

## 2020-08-19 ENCOUNTER — Ambulatory Visit: Payer: BC Managed Care – PPO | Admitting: Obstetrics and Gynecology

## 2020-08-19 DIAGNOSIS — R87810 Cervical high risk human papillomavirus (HPV) DNA test positive: Secondary | ICD-10-CM

## 2020-08-19 DIAGNOSIS — R87610 Atypical squamous cells of undetermined significance on cytologic smear of cervix (ASC-US): Secondary | ICD-10-CM | POA: Diagnosis not present

## 2020-08-19 NOTE — Progress Notes (Signed)
  Subjective:     Patient ID: Anna Chavez, female   DOB: January 16, 1961, 60 y.o.   MRN: [id number]  HPI   Patient here today for colposcopy with pap smear 06-11-20 ASCUS:Pos HR HPV.  Pap History: 06-11-20 ASCUS:Pos HR HPV  11-11-18 Neg:Neg HR HPV  Switched to Estrace 1 mg and Prometrium 200 mg nightly after her visit 06/11/20. Doing well.   Review of Systems  LMP:PMP Contraception:PMP      Objective:   Physical Exam Genitourinary:      Colposcopy - cervix, vagina. Consent for procedure.  3% acetic acid used in vagina. White light and green light filter used.  Colposcopy satisfactory:  Yes   _x__          No    _____ Findings:    Cervix is smaller posteriorly than anteriorly.  Cervix:  acetowhite changes at 4:00, 9:00, and 1:00.  Vagina:  No acetowhite lesions.  Tenaculum placed to anterior cervical lip.  Biopsies:  ECC, 4:00, 9:00, and 1:00 Monsel's placed.  Minimal EBL. No complications.     Assessment:     ASCUS and positive HR HPV.     Plan:     Fu biopsies.  Post colposcopy instructions and precautions given. Final plan to follow.  If needs a LEEP, I would recommend doing in an outpatient surgery center.

## 2020-08-19 NOTE — Patient Instructions (Signed)

## 2020-08-23 ENCOUNTER — Telehealth: Payer: Self-pay

## 2020-08-23 LAB — SURGICAL PATHOLOGY

## 2020-08-23 NOTE — Telephone Encounter (Signed)
Patient received her biopsy results in her My Chart and called.  Advised her that it was just released this morning and Dr Edward Jolly has not had time yet to review and make recommendation.  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 Edward Jolly reviews.

## 2020-09-11 ENCOUNTER — Other Ambulatory Visit: Payer: Self-pay | Admitting: Obstetrics and Gynecology

## 2020-09-13 NOTE — Telephone Encounter (Signed)
Medication refill request: Estradiol 1 mg tablet Last AEX:  03-04-20 BS Next AEX: not scheduled  Last MMG (if hormonal medication request): 02-12-20 density C/BIRADS 1 negative  Refill authorized: Today, please advise.   Medication refill request: Progesterone  Refill authorized: Today, please advise.   Medications pended for #90, 0RF. Please refill if appropriate.

## 2020-10-01 ENCOUNTER — Telehealth: Payer: Self-pay | Admitting: *Deleted

## 2020-10-01 NOTE — Telephone Encounter (Addendum)
Pt called requesting a prescription be sent in to her pharmacy to treat vaginal itching, symptoms has been present for 3 days. No other symptoms. Will send a message to her Provider for recommendations/advice.DR. Edward Jolly is off will route to another Provider for advice.

## 2020-10-01 NOTE — Telephone Encounter (Signed)
Per Clarita Crane NP try Monistat and make an appt for next week.  Spoke with patient she is aware of recommendations.

## 2020-10-01 NOTE — Telephone Encounter (Signed)
Pt called again with added on symptoms of possible yeast infection, vaginal discharge. Pt states she was on antibiotics which may have caused the infection.I will rely message to provider for advice.

## 2020-10-14 NOTE — Progress Notes (Signed)
GYNECOLOGY  VISIT  CC:   Constantly wanting to sleep  HPI: 60 y.o. G36P0002 Single White or Caucasian female here for fatigue.  Eats healthy, exercises, always struggling to stay awake.   These symptoms have been going on for years.  Her new boyfriend told her it just isn't normal to sleep that much and suggested she go get evaluated.  As a child was told she had hypothyroid, started medication but was later told she didn't have it.  Sleeps about 6-8 hours per night. Does not feel rested in am. Hard to get out of bed. Takes nap when ever she can, sleeps 3 hours for nap when she can.  Exercises 3-4 times per week. Since Covid, doing work out classes in her garage. Also walks when possible. Estimates over 150 min per week.  Had a severe covid infection. "Almost died" Out of work 6 weeks, 2019-06-07. Double pneumonia. Feels like fatigue is worse since then, but started before then.  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  Does not feel depressed at this time. But went through terrible stress from 2008-2018. (Husband loss of job, lost house and all money, filed bankruptcy, divorced)  Moved to the area 3 years ago from [place], and does not feel like she has had a good health evaluation since then. Has been working with Dr. Quincy Simmonds with hormone concerns, otherwise, does not have any GYN concerns today. She presents to this office because she does not know where to turn now.    In addition to sleepiness, she is also concerned about frequent headaches and memory loss. She reports having "no memory" of events that took place in her presence. Wants to know where she can go to get that evaluated further.   GYNECOLOGIC HISTORY: Patient's last menstrual period was 05/15/2016 (approximate). Contraception: postmenopausal Menopausal hormone therapy: prometrium, estrace tablet  Patient Active Problem List   Diagnosis Date Noted  . Stress incontinence 09/29/2017  .  Menopausal symptoms 09/29/2017    Past Medical History:  Diagnosis Date  . Anxiety   . Depression   . Fibromyalgia   . Herpes simplex type 2 infection 2020  . Hormone disorder   . Hyperlipidemia   . Infertility, female   . Lactose intolerance   . Ovarian hyperstimulation syndrome   . PCOS (polycystic ovarian syndrome)   . Urinary incontinence     Past Surgical History:  Procedure Laterality Date  . ABDOMINAL SURGERY  2004  . BREAST REDUCTION SURGERY Bilateral 2004  . COLPOSCOPY    . COSMETIC SURGERY    . DILATION AND CURETTAGE OF UTERUS    . FERTILITY SURGERY    . OOPHORECTOMY    . REDUCTION MAMMAPLASTY Bilateral     MEDS:   Current Outpatient Medications on File Prior to Visit  Medication Sig Dispense Refill  . busPIRone (BUSPAR) 15 MG tablet Take 1 tablet by mouth 2 (two) times daily.    . DULoxetine (CYMBALTA) 60 MG capsule Take 1 capsule by mouth daily.    Marland Kitchen estradiol (ESTRACE) 1 MG tablet TAKE 1 TABLET BY MOUTH EVERY DAY 90 tablet 1  . Multiple Vitamin (MULTIVITAMIN) capsule Take 1 capsule by mouth daily.    . progesterone (PROMETRIUM) 200 MG capsule TAKE 1 CAPSULE BY MOUTH EVERY DAY 90 capsule 1  . valACYclovir (VALTREX) 500 MG tablet Take 1 tablet (500 mg total) by mouth daily. Take as prevention.  Take one tablet by mouth twice daily  for 3 days for an outbreak. 40 tablet 11  . Estradiol (VAGIFEM) 10 MCG TABS vaginal tablet Place 1 tablet (10 mcg total) vaginally 2 (two) times a week. (Patient not taking: Reported on 10/15/2020) 8 tablet 11   No current facility-administered medications on file prior to visit.    ALLERGIES: Patient has no known allergies.  Family History  Problem Relation Age of Onset  . Depression Mother   . Pancreatic cancer Mother   . Diabetes Mother   . Obesity Mother   . Heart Problems Mother   . Depression Father   . Cancer Maternal Grandmother   . Dementia Maternal Grandmother   . Breast cancer Maternal Grandmother   . ADD / ADHD  Daughter      Review of Systems  Constitutional: Positive for fatigue.  HENT: Negative.   Eyes: Negative.   Respiratory: Negative.   Cardiovascular: Negative.   Gastrointestinal: Negative.   Endocrine: Negative.   Genitourinary: Negative.   Musculoskeletal: Negative.   Skin: Negative.   Allergic/Immunologic: Negative.   Neurological: Negative.   Hematological: Negative.   Psychiatric/Behavioral: Negative.     PHYSICAL EXAMINATION:    BP 120/70   Pulse 76   Resp 16   Wt 186 lb (84.4 kg)   LMP 05/15/2016 (Approximate)   BMI 31.93 kg/m     General appearance: alert, cooperative, no acute distress Thyroid: Does not palpate enlarged CV:  Regular rate and rhythm Lungs:  clear to auscultation, no wheezes, rales or rhonchi, symmetric air entry Abdomen: soft, non-tender; no masses,  no organomegaly Lymph:  no LAD noted    Assessment: Fatigue, unspecified type - Plan: TSH  Lipid screening - Plan: Lipid Profile  Screening for endocrine, metabolic and immunity disorder - Plan: Comp Met (CMET), TSH, Hemoglobin A1c  Screening for deficiency anemia - Plan: CBC   Plan: No obvious physical exam findings that explain symptoms Recommend blood work as above Encouraged to establish with PCP Will contact patient when lab results are back for more guidance based on results.   25 minutes of total time was spent for this patient encounter, including preparation, face-to-face counseling with the patient and coordination of care, and documentation of the encounter.

## 2020-10-15 ENCOUNTER — Encounter: Payer: Self-pay | Admitting: Nurse Practitioner

## 2020-10-15 ENCOUNTER — Other Ambulatory Visit: Payer: Self-pay

## 2020-10-15 ENCOUNTER — Ambulatory Visit: Payer: BC Managed Care – PPO | Admitting: Nurse Practitioner

## 2020-10-15 VITALS — BP 120/70 | HR 76 | Resp 16 | Wt 186.0 lb

## 2020-10-15 DIAGNOSIS — Z130 Encounter for screening for diseases of the blood and blood-forming organs and certain disorders involving the immune mechanism: Secondary | ICD-10-CM

## 2020-10-15 DIAGNOSIS — Z1329 Encounter for screening for other suspected endocrine disorder: Secondary | ICD-10-CM | POA: Diagnosis not present

## 2020-10-15 DIAGNOSIS — Z13220 Encounter for screening for lipoid disorders: Secondary | ICD-10-CM | POA: Diagnosis not present

## 2020-10-15 DIAGNOSIS — R5383 Other fatigue: Secondary | ICD-10-CM | POA: Diagnosis not present

## 2020-10-15 DIAGNOSIS — Z13228 Encounter for screening for other metabolic disorders: Secondary | ICD-10-CM

## 2020-10-16 LAB — CBC
HCT: 39 % (ref 35.0–45.0)
Hemoglobin: 13 g/dL (ref 11.7–15.5)
MCH: 31.3 pg (ref 27.0–33.0)
MCHC: 33.3 g/dL (ref 32.0–36.0)
MCV: 94 fL (ref 80.0–100.0)
MPV: 11.2 fL (ref 7.5–12.5)
Platelets: 292 10*3/uL (ref 140–400)
RBC: 4.15 10*6/uL (ref 3.80–5.10)
RDW: 12 % (ref 11.0–15.0)
WBC: 7.4 10*3/uL (ref 3.8–10.8)

## 2020-10-16 LAB — COMPREHENSIVE METABOLIC PANEL
AG Ratio: 1.9 (calc) (ref 1.0–2.5)
ALT: 18 U/L (ref 6–29)
AST: 18 U/L (ref 10–35)
Albumin: 4.2 g/dL (ref 3.6–5.1)
Alkaline phosphatase (APISO): 62 U/L (ref 37–153)
BUN: 19 mg/dL (ref 7–25)
CO2: 28 mmol/L (ref 20–32)
Calcium: 9.6 mg/dL (ref 8.6–10.4)
Chloride: 104 mmol/L (ref 98–110)
Creat: 0.69 mg/dL (ref 0.50–1.05)
Globulin: 2.2 g/dL (calc) (ref 1.9–3.7)
Glucose, Bld: 95 mg/dL (ref 65–99)
Potassium: 4.1 mmol/L (ref 3.5–5.3)
Sodium: 139 mmol/L (ref 135–146)
Total Bilirubin: 0.3 mg/dL (ref 0.2–1.2)
Total Protein: 6.4 g/dL (ref 6.1–8.1)

## 2020-10-16 LAB — LIPID PANEL
Cholesterol: 231 mg/dL — ABNORMAL HIGH (ref <200)
HDL: 54 mg/dL (ref >=50)
LDL Cholesterol (Calc): 144 mg/dL (calc) — ABNORMAL HIGH
Non-HDL Cholesterol (Calc): 177 mg/dL (calc) — ABNORMAL HIGH (ref <130)
Total CHOL/HDL Ratio: 4.3 (calc) (ref <5.0)
Triglycerides: 192 mg/dL — ABNORMAL HIGH (ref <150)

## 2020-10-16 LAB — HEMOGLOBIN A1C
Hgb A1c MFr Bld: 5.2 % of total Hgb (ref <5.7)
Mean Plasma Glucose: 103 mg/dL
eAG (mmol/L): 5.7 mmol/L

## 2020-10-16 LAB — TSH: TSH: 1.62 mIU/L (ref 0.40–4.50)

## 2020-10-18 ENCOUNTER — Encounter: Payer: Self-pay | Admitting: Nurse Practitioner

## 2020-10-18 NOTE — Patient Instructions (Signed)

## 2020-10-21 ENCOUNTER — Telehealth: Payer: Self-pay | Admitting: *Deleted

## 2020-10-21 NOTE — Telephone Encounter (Signed)
Patient called currently taking estradiol 1 mg tablet and progesterone 200 mg both at bedtime. Patient said she feels extremely fatigue all the time, having a hard getting up in the mornings and staying awake at work.  She asked if she could just stop the HRT or best to wean off? If she should wean off, she would like to know how to do this? She is having night sweats, but she would have the nights sweats verses the fatigue. Please advise

## 2020-10-21 NOTE — Telephone Encounter (Signed)
Patient asked I leave a detailed message on her voicemail because she is working. Detailed message left on voicemail. I told her I would also send a my chart message for patient to re-read and get back with me.

## 2020-10-21 NOTE — Telephone Encounter (Signed)
It is possible that the Prometrium is causing fatigue for her.  Another progesterone may be a better option for her.  If she would like to switch her progesterone from Prometrium to Provera 2.5 mg daily, I would be ok with that.   She would still continue the estradiol 1 mg daily.   If she wishes to discontinue all HRT, she can do it all at once or wean off. Weaning off would make it easier to deal with hot flashes. For a weaning regimen, I would recommend switching to Estrace 0.5 mg daily and Prometrium 100 mg nightly for 3 months and then stop everything.

## 2020-10-26 NOTE — Telephone Encounter (Signed)
Ok to increase her Estradiol to 1 mg po bid.  Disp:  60, RF:  3  She will also need to increase her Provera to 5 mg daily. Disp:  30, RF: 3.  Annual exam is due in October.

## 2020-10-26 NOTE — Telephone Encounter (Signed)
Patient called back. I read her the message from Dr. Edward Jolly and offered her those options.  Patient opts for Provera 2.5 mg daily.  However, she is complaining of terrible night sweats.  She said the day time hot flashes are much better with the Estradiol 1 mg.  But still nighttime is very uncomfortable. She is asking about possibly increasing this dose.

## 2020-10-27 MED ORDER — ESTRADIOL 1 MG PO TABS: 1.0000 mg | ORAL_TABLET | Freq: Two times a day (BID) | ORAL | 3 refills | Status: DC

## 2020-10-27 MED ORDER — MEDROXYPROGESTERONE ACETATE 5 MG PO TABS: 5.0000 mg | ORAL_TABLET | Freq: Every day | ORAL | 3 refills | Status: DC

## 2020-10-27 NOTE — Telephone Encounter (Signed)
Patient aware, Rx sent.  

## 2020-12-14 ENCOUNTER — Telehealth: Payer: Self-pay | Admitting: *Deleted

## 2020-12-14 NOTE — Telephone Encounter (Signed)
Patient called and left message in triage voicemail requesting a call back. I called and received voicemail, message left to call.

## 2020-12-15 NOTE — Telephone Encounter (Signed)
Patient called back stating she started bleeding over the weekend, started off spotting, then light flow, cramping taking over the counter medication for cramps. Patient said she still feels fatigue, headaches, night sweats these symptoms are not need. Patient said the bleeding today is better, only notice spotting when wiping now. Patient said she is very frustrated with the symptoms. Patient she is getting established with PCP,has appointment on 12/20/20 with Dr.Silva. Patient is taking HRT estradiol 1 mg tablet and provera 5 mg tablet daily. I told her monitor bleeding, if bleeding becomes heavier changing pad/tampon less than 1 hour to call the office or see ER after hours. Patient verbalized she understood and will follow as needed.

## 2020-12-20 ENCOUNTER — Ambulatory Visit: Payer: BC Managed Care – PPO | Admitting: Obstetrics and Gynecology

## 2020-12-20 NOTE — Progress Notes (Signed)
GYNECOLOGY  VISIT   HPI: 60 y.o.   Single  Caucasian  female   G2P0002 with Patient's last menstrual period was 05/15/2016 (approximate).   here for postmenopausal bleeding x 2 weeks. Started as spotted and became heavier.  She has some cramping and diarrhea, which resolved.   She was taking Estrace 2 mg daily starting in June and now she is back to taking 1 mg daily.  Taking Provera 5 mg daily.  The night is the worse time for her sweating.  She felt worse with the Estrace 2 mg.  She does have hot flashes day and night. Feeling more agitated.   The bleeding occurred when she reduced her dosage of the estrogen back down to 1 mg.   She stopped her Vagifem.   Friend told her to ask about hysterectomy.   Taking Cymbalta for 4 years.   Going on vacation to the [place] to see family.   GYNECOLOGIC HISTORY: Patient's last menstrual period was 05/15/2016 (approximate). Contraception:  PMP Menopausal hormone therapy: Estrace, Progesterone Last mammogram: 01-16-20 Neg/BiRads1 Last pap smear: 06-11-20 ASCUS:Pos HR HPV, 11-11-18 Neg:Neg HR HPV, 09-27-17 Neg:Neg HR HPV        OB History     Gravida  2   Para  2   Term  0   Preterm  0   AB  0   Living  2      SAB  0   IAB  0   Ectopic  0   Multiple  0   Live Births  0              Patient Active Problem List   Diagnosis Date Noted   Stress incontinence 09/29/2017   Menopausal symptoms 09/29/2017    Past Medical History:  Diagnosis Date   Anxiety    Depression    Fibromyalgia    Herpes simplex type 2 infection 2020   Hormone disorder    Hyperlipidemia    Infertility, female    Lactose intolerance    Ovarian hyperstimulation syndrome    PCOS (polycystic ovarian syndrome)    Urinary incontinence     Past Surgical History:  Procedure Laterality Date   ABDOMINAL SURGERY  2004   BREAST REDUCTION SURGERY Bilateral 2004   COLPOSCOPY     COSMETIC SURGERY     DILATION AND CURETTAGE OF UTERUS      FERTILITY SURGERY     OOPHORECTOMY     REDUCTION MAMMAPLASTY Bilateral     Current Outpatient Medications  Medication Sig Dispense Refill   busPIRone (BUSPAR) 15 MG tablet Take 1 tablet by mouth 2 (two) times daily.     DULoxetine (CYMBALTA) 60 MG capsule Take 1 capsule by mouth daily.     estradiol (ESTRACE) 1 MG tablet Take 1 tablet (1 mg total) by mouth 2 (two) times daily. 60 tablet 3   medroxyPROGESTERone (PROVERA) 5 MG tablet Take 1 tablet (5 mg total) by mouth daily. 30 tablet 3   Multiple Vitamin (MULTIVITAMIN) capsule Take 1 capsule by mouth daily.     valACYclovir (VALTREX) 500 MG tablet Take 1 tablet (500 mg total) by mouth daily. Take as prevention.  Take one tablet by mouth twice daily for 3 days for an outbreak. 40 tablet 11   Estradiol (VAGIFEM) 10 MCG TABS vaginal tablet Place 1 tablet (10 mcg total) vaginally 2 (two) times a week. (Patient not taking: No sig reported) 8 tablet 11   No current facility-administered medications for  this visit.     ALLERGIES: Patient has no known allergies.  Family History  Problem Relation Age of Onset   Depression Mother    Pancreatic cancer Mother    Diabetes Mother    Obesity Mother    Heart Problems Mother    Depression Father    Cancer Maternal Grandmother    Dementia Maternal Grandmother    Breast cancer Maternal Grandmother    ADD / ADHD Daughter     Social History   Socioeconomic History   Marital status: Single    Spouse name: Not on file   Number of children: Not on file   Years of education: Not on file   Highest education level: Not on file  Occupational History   Not on file  Tobacco Use   Smoking status: Never   Smokeless tobacco: Never  Vaping Use   Vaping Use: Never used  Substance and Sexual Activity   Alcohol use: Yes    Comment: 2 glasses of wine/month   Drug use: Never   Sexual activity: Yes    Partners: Male    Birth control/protection: Post-menopausal  Other Topics Concern   Not on file   Social History Narrative   Not on file   Social Determinants of Health   Financial Resource Strain: Not on file  Food Insecurity: Not on file  Transportation Needs: Not on file  Physical Activity: Not on file  Stress: Not on file  Social Connections: Not on file  Intimate Partner Violence: Not on file    Review of Systems  Genitourinary:  Positive for vaginal bleeding.  All other systems reviewed and are negative.  PHYSICAL EXAMINATION:    BP (!) 164/72   Pulse (!) 116   Ht 5\' 4"  (1.626 m)   Wt 183 lb (83 kg)   LMP 05/15/2016 (Approximate)   SpO2 98%   BMI 31.41 kg/m     General appearance: alert, cooperative and appears stated age  Pelvic: External genitalia:  no lesions              Urethra:  normal appearing urethra with no masses, tenderness or lesions              Bartholins and Skenes: normal                 Vagina: normal appearing vagina with normal color and discharge, no lesions              Cervix: no lesions                Bimanual Exam:  Uterus:  normal size, contour, position, consistency, mobility, non-tender              Adnexa: no mass, fullness, tenderness                           Anus:  hemorrhoid.  Chaperone was present for exam:  07/13/2016, RN  ASSESSMENT  Postmenopausal bleeding.  HRT.  Menopausal symptoms.   PLAN  Reduce Provera to 2.5 mg daily.  Continue Estrace 1 mg daily. Start Neurontin.  Start at 100 mg nightly and increase by 100 mg weekly up to 300 mg q hs. She will see her PCP about her Cymbalta, which may be contributing to her diaphoresis.  Hysterectomy not recommended at this time, as this will not change her hormonal balance and menopausal symptoms.  Return for pelvic Noreene Larsson and possible endometrial biopsy.  Annual exam in 3 months.  An After Visit Summary was printed and given to the patient.  30 min total time was spent for this patient encounter, including preparation, face-to-face counseling with the patient, coordination of  care, and documentation of the encounter.

## 2020-12-22 ENCOUNTER — Other Ambulatory Visit: Payer: Self-pay

## 2020-12-22 ENCOUNTER — Ambulatory Visit: Payer: BC Managed Care – PPO | Admitting: Obstetrics and Gynecology

## 2020-12-22 ENCOUNTER — Encounter: Payer: Self-pay | Admitting: Obstetrics and Gynecology

## 2020-12-22 VITALS — BP 164/72 | HR 116 | Ht 64.0 in | Wt 183.0 lb

## 2020-12-22 DIAGNOSIS — N950 Postmenopausal bleeding: Secondary | ICD-10-CM

## 2020-12-22 DIAGNOSIS — N951 Menopausal and female climacteric states: Secondary | ICD-10-CM

## 2020-12-22 MED ORDER — MEDROXYPROGESTERONE ACETATE 2.5 MG PO TABS: 2.5000 mg | ORAL_TABLET | Freq: Every day | ORAL | 0 refills | Status: DC

## 2020-12-22 MED ORDER — GABAPENTIN 100 MG PO CAPS: ORAL_CAPSULE | ORAL | 1 refills | Status: DC

## 2021-01-20 ENCOUNTER — Ambulatory Visit: Payer: BC Managed Care – PPO | Admitting: Obstetrics and Gynecology

## 2021-01-20 ENCOUNTER — Encounter: Payer: Self-pay | Admitting: Obstetrics and Gynecology

## 2021-01-20 ENCOUNTER — Other Ambulatory Visit: Payer: Self-pay

## 2021-01-20 ENCOUNTER — Ambulatory Visit (INDEPENDENT_AMBULATORY_CARE_PROVIDER_SITE_OTHER): Payer: BC Managed Care – PPO

## 2021-01-20 ENCOUNTER — Other Ambulatory Visit (HOSPITAL_COMMUNITY)
Admission: RE | Admit: 2021-01-20 | Discharge: 2021-01-20 | Disposition: A | Discharge status: Home / Self Care | Payer: BC Managed Care – PPO | Source: Ambulatory Visit | Attending: Obstetrics and Gynecology | Admitting: Obstetrics and Gynecology

## 2021-01-20 VITALS — BP 138/80 | HR 83 | Ht 64.0 in | Wt 183.0 lb

## 2021-01-20 DIAGNOSIS — Z5181 Encounter for therapeutic drug level monitoring: Secondary | ICD-10-CM

## 2021-01-20 DIAGNOSIS — N9489 Other specified conditions associated with female genital organs and menstrual cycle: Secondary | ICD-10-CM | POA: Insufficient documentation

## 2021-01-20 DIAGNOSIS — N950 Postmenopausal bleeding: Secondary | ICD-10-CM | POA: Insufficient documentation

## 2021-01-20 DIAGNOSIS — N951 Menopausal and female climacteric states: Secondary | ICD-10-CM

## 2021-01-20 NOTE — Progress Notes (Signed)
GYNECOLOGY  VISIT   HPI: 60 y.o.   Single  Caucasian  female   G2P0002 with Patient's last menstrual period was 05/15/2016 (approximate).   here for pelvic ultrasound and EMB for postmenopausal bleeding.  Patient could not confirm medication list.  Gabapentin helping night sweats.   Now on Trintellix and off of her other antidepressant (Cymbalta?)  GYNECOLOGIC HISTORY: Patient's last menstrual period was 05/15/2016 (approximate). Contraception:  PMP Menopausal hormone therapy:  Estrace, Progesterone Last mammogram: 01-16-20 Neg/BiRads1 Last pap smear:  06-11-20 ASCUS:Pos HR HPV, 11-11-18 Neg:Neg HR HPV, 09-27-17 Neg:Neg HR HPV        OB History     Gravida  2   Para  2   Term  0   Preterm  0   AB  0   Living  2      SAB  0   IAB  0   Ectopic  0   Multiple  0   Live Births  0              Patient Active Problem List   Diagnosis Date Noted   Stress incontinence 09/29/2017   Menopausal symptoms 09/29/2017    Past Medical History:  Diagnosis Date   Anxiety    Depression    Fibromyalgia    Herpes simplex type 2 infection 2020   Hormone disorder    Hyperlipidemia    Infertility, female    Lactose intolerance    Ovarian hyperstimulation syndrome    PCOS (polycystic ovarian syndrome)    Urinary incontinence     Past Surgical History:  Procedure Laterality Date   ABDOMINAL SURGERY  2004   BREAST REDUCTION SURGERY Bilateral 2004   COLPOSCOPY     COSMETIC SURGERY     DILATION AND CURETTAGE OF UTERUS     FERTILITY SURGERY     OOPHORECTOMY     REDUCTION MAMMAPLASTY Bilateral     Current Outpatient Medications  Medication Sig Dispense Refill   busPIRone (BUSPAR) 15 MG tablet Take 1 tablet by mouth 2 (two) times daily.     DULoxetine (CYMBALTA) 60 MG capsule Take 1 capsule by mouth daily.     estradiol (ESTRACE) 1 MG tablet Take 1 tablet (1 mg total) by mouth 2 (two) times daily. 60 tablet 3   Estradiol (VAGIFEM) 10 MCG TABS vaginal tablet Place 1  tablet (10 mcg total) vaginally 2 (two) times a week. 8 tablet 11   gabapentin (NEURONTIN) 100 MG capsule Take one capsule (100 mg) by mouth nightly for one week.  Increase to two capsules (200 mg) by mouth nightly for one week.  Then increase to three capsules (300 mg) nightly. 90 capsule 1   medroxyPROGESTERone (PROVERA) 2.5 MG tablet Take 1 tablet (2.5 mg total) by mouth daily. 90 tablet 0   Multiple Vitamin (MULTIVITAMIN) capsule Take 1 capsule by mouth daily.     TRINTELLIX 10 MG TABS tablet Take 10 mg by mouth daily.     valACYclovir (VALTREX) 500 MG tablet Take 1 tablet (500 mg total) by mouth daily. Take as prevention.  Take one tablet by mouth twice daily for 3 days for an outbreak. 40 tablet 11   No current facility-administered medications for this visit.     ALLERGIES: Patient has no known allergies.  Family History  Problem Relation Age of Onset   Depression Mother    Pancreatic cancer Mother    Diabetes Mother    Obesity Mother    Heart Problems Mother  Depression Father    Cancer Maternal Grandmother    Dementia Maternal Grandmother    Breast cancer Maternal Grandmother    ADD / ADHD Daughter     Social History   Socioeconomic History   Marital status: Single    Spouse name: Not on file   Number of children: Not on file   Years of education: Not on file   Highest education level: Not on file  Occupational History   Not on file  Tobacco Use   Smoking status: Never   Smokeless tobacco: Never  Vaping Use   Vaping Use: Never used  Substance and Sexual Activity   Alcohol use: Yes    Comment: 2 glasses of wine/month   Drug use: Never   Sexual activity: Yes    Partners: Male    Birth control/protection: Post-menopausal  Other Topics Concern   Not on file  Social History Narrative   Not on file   Social Determinants of Health   Financial Resource Strain: Not on file  Food Insecurity: Not on file  Transportation Needs: Not on file  Physical Activity:  Not on file  Stress: Not on file  Social Connections: Not on file  Intimate Partner Violence: Not on file    Review of Systems  All other systems reviewed and are negative.  PHYSICAL EXAMINATION:    BP 138/80   Pulse 83   Ht 5\' 4"  (1.626 m)   Wt 183 lb (83 kg)   LMP 05/15/2016 (Approximate)   SpO2 96%   BMI 31.41 kg/m     General appearance: alert, cooperative and appears stated age  Pelvic: External genitalia:  no lesions              Urethra:  normal appearing urethra with no masses, tenderness or lesions              Bartholins and Skenes: normal                 Vagina: normal appearing vagina with normal color and discharge, no lesions              Cervix: no lesions                Bimanual Exam:  Uterus:  normal size, contour, position, consistency, mobility, non-tender              Adnexa: no mass, fullness, tenderness          Pelvic 07/13/2016  Uterus 10.46 x 6.5 x 4.3 cm. 3.5 x 2.7 cm pedunculated fibroid near right adnexa. EMS 5.04 mm, irregular with feeder vessel. 3D imaging suggesting endometrial mass.  Ovaries atrophic. No free fluid.   EMB Consent done.  Hibiclens prep.  Local 1% lidocaine, lot [place], exp 08/2022.  Tenaculum to anterior cervical lip.  EMB with Pipelle to 8 cm x 2.  Tissue to pathology. Minimal EBL. No complications.   Chaperone was present for exam:  Joy, CMA.  ASSESSMENT  Postmenopausal bleeding. Endometrial mass.  HRT.  Menopausal symptoms improved on Neurontin.    Medication monitoring encounter.  Off Cymbalta? and now on Trintellix.  PLAN  Pelvic 09/2022 images and report reviewed. Fu EMB.  Final plan to follow.   Continue Neurontin.  Has annual exam 04/04/21.  29 min  total time was spent for this patient encounter, including preparation, face-to-face counseling with the patient, coordination of care, and documentation of the encounter.

## 2021-01-20 NOTE — Patient Instructions (Signed)
Endometrial Biopsy An endometrial biopsy is a procedure to remove tissue samples from the endometrium, which is the lining of the uterus. The tissue that is removed can then be checked under a microscope for disease. This procedure is used to diagnose conditions such as endometrial cancer, endometrial tuberculosis, polyps, or other inflammatory conditions. This procedure may also be used to investigate uterine bleeding to determine where you are in your menstrual cycle or how your hormone levels are affecting the lining of the uterus. Tell a health care provider about: Any allergies you have. All medicines you are taking, including vitamins, herbs, eye drops, creams, and over-the-counter medicines. Any problems you or family members have had with anesthetic medicines. Any blood disorders you have. Any surgeries you have had. Any medical conditions you have. Whether you are pregnant or may be pregnant. What are the risks? Generally, this is a safe procedure. However, problems may occur, including: Bleeding. Pelvic infection. Puncture of the wall of the uterus with the biopsy device (rare). Allergic reactions to medicines. What happens before the procedure? Keep a record of your menstrual cycles as told by your health care provider. You may need to schedule your procedure for a specific time in your cycle. You may want to bring a sanitary pad to wear after the procedure. Plan to have someone take you home from the hospital or clinic. Ask your health care provider about: Changing or stopping your regular medicines. This is especially important if you are taking diabetes medicines, arthritis medicines, or blood thinners. Taking medicines such as aspirin and ibuprofen. These medicines can thin your blood. Do not take these medicines unless your health care provider tells you to take them. Taking over-the-counter medicines, vitamins, herbs, and supplements. What happens during the procedure? You  will lie on an exam table with your feet and legs supported as in a pelvic exam. Your health care provider will insert an instrument (speculum) into your vagina to see your cervix. Your cervix will be cleansed with an antiseptic solution. A medicine (local anesthetic) will be used to numb the cervix. A forceps instrument (tenaculum) will be used to hold your cervix steady for the biopsy. A thin, rod-like instrument (uterine sound) will be inserted through your cervix to determine the length of your uterus and the location where the biopsy sample will be removed. A thin, flexible tube (catheter) will be inserted through your cervix and into the uterus. The catheter will be used to collect the biopsy sample from your endometrial tissue. The catheter and speculum will then be removed, and the tissue sample will be sent to a lab for examination. The procedure may vary among health care providers and hospitals. What can I expect after procedure? You will rest in a recovery area until you are ready to go home. You may have mild cramping and a small amount of vaginal bleeding. This is normal. You may have a small amount of vaginal bleeding for a few days. This is normal. It is up to you to get the results of your procedure. Ask your health care provider, or the department that is doing the procedure, when your results will be ready. Follow these instructions at home: Take over-the-counter and prescription medicines only as told by your health care provider. Do not douche, use tampons, or have sexual intercourse until your health care provider approves. Return to your normal activities as told by your health care provider. Ask your health care provider what activities are safe for you. Follow instructions   from your health care provider about any activity restrictions, such as restrictions on strenuous exercise or heavy lifting. Keep all follow-up visits. This is important. Contact a health care  provider: You have heavy bleeding, or bleed for longer than 2 days after the procedure. You have bad smelling discharge from your vagina. You have a fever or chills. You have a burning sensation when urinating or you have difficulty urinating. You have severe pain in your lower abdomen. Get help right away if you: You have severe cramps in your stomach or back. You pass large blood clots. Your bleeding increases. You become weak or light-headed, or you faint or lose consciousness. Summary An endometrial biopsy is a procedure to remove tissue samples is taken from the endometrium, which is the lining of the uterus. The tissue sample that is removed will be checked under a microscope for disease. This procedure is used to diagnose conditions such as endometrial cancer, endometrial tuberculosis, polyps, or other inflammatory conditions. After the procedure, it is common to have mild cramping and a small amount of vaginal bleeding for a few days. Do not douche, use tampons, or have sexual intercourse until your health care provider approves. Ask your health care provider which activities are safe for you. This information is not intended to replace advice given to you by your health care provider. Make sure you discuss any questions you have with your health care provider. Document Revised: 11/24/2019 Document Reviewed: 11/24/2019 Elsevier Patient Education  2022 [employer] Inc.  

## 2021-01-24 LAB — SURGICAL PATHOLOGY

## 2021-01-25 ENCOUNTER — Telehealth: Payer: Self-pay | Admitting: *Deleted

## 2021-01-25 ENCOUNTER — Other Ambulatory Visit: Payer: Self-pay | Admitting: Obstetrics and Gynecology

## 2021-01-25 MED ORDER — MEDROXYPROGESTERONE ACETATE 5 MG PO TABS: 5.0000 mg | ORAL_TABLET | Freq: Every day | ORAL | 1 refills | Status: DC

## 2021-01-25 MED ORDER — ESTRADIOL 1 MG PO TABS: 1.0000 mg | ORAL_TABLET | Freq: Every day | ORAL | 1 refills | Status: DC

## 2021-01-25 NOTE — Telephone Encounter (Signed)
Please see result note from this evening.

## 2021-01-25 NOTE — Telephone Encounter (Signed)
Patient called biopsy results have been released and seen by patient on my chart. Patient would like recommendations for next step. Please advise

## 2021-01-26 ENCOUNTER — Telehealth: Payer: Self-pay | Admitting: Obstetrics and Gynecology

## 2021-01-26 ENCOUNTER — Telehealth: Payer: Self-pay

## 2021-01-26 ENCOUNTER — Other Ambulatory Visit: Payer: Self-pay

## 2021-01-26 ENCOUNTER — Other Ambulatory Visit: Payer: Self-pay | Admitting: Obstetrics and Gynecology

## 2021-01-26 MED ORDER — MEDROXYPROGESTERONE ACETATE 2.5 MG PO TABS: 2.5000 mg | ORAL_TABLET | Freq: Every day | ORAL | 1 refills | Status: DC

## 2021-01-26 NOTE — Telephone Encounter (Signed)
Ok to go directly to Hysteroscopy with Myosure resection of polyp, dilation and curettage.  I will have surgery scheduling team reach out to her.

## 2021-01-26 NOTE — Telephone Encounter (Signed)
I spoke with patient and informed her of result note.

## 2021-01-26 NOTE — Telephone Encounter (Signed)
Please precert and schedule hysteroscopy with Myosure resection of endometrial polyp, dilation and curettage.   Diagnosis:  postmenopausal bleeding, endometrial polyp.   Surgery at [facility].   Time needed 1 hour.   Preop is needed.

## 2021-01-26 NOTE — Telephone Encounter (Signed)
The result note info was relayed to patient and [facility] was explained.  She wanted me to ask Dr. Edward Jolly why she has to have [facility] and cannot just go straight to [facility]?  She said she is frustrated and tired of all these doctor appointments and cannot keep taking time off work, jeopardizing her job.

## 2021-01-26 NOTE — Telephone Encounter (Signed)
Patient's voice mail box is still full. I did let her know when I spoke with her earlier. I sent her a My Chart message with Dr. Rica Records reply.

## 2021-01-26 NOTE — Telephone Encounter (Signed)
-----   Message from Patton Salles, MD sent at 01/25/2021  8:40 PM EDT ----- Please inform patient of results of endometrial biopsy showing benign endometrial polyp.   I recommend she return for a sonohysterogram for confirmation of the polyp.  This ultrasound will confirm if the polyp is large enough to need removal with surgery.   Please also let her know that I attempted to update her HRT dosages in Epic tonight.  I did send refills to her pharmacy for the estrogen and progesterone.  The estradiol is 1 mg daily.  The provera is supposed to be 2.5 mg daily and will need to be corrected.  (I sent in refills for 5 mg which is not correct.)

## 2021-01-27 NOTE — Telephone Encounter (Signed)
Spoke with patient regarding surgery benefits. Patient acknowledges understanding of information presented. Patient is aware that benefits presented are professional benefits only. Patient is aware that once surgery is scheduled, the hospital will call with separate benefits. See account note.  Routing to Carmelina Dane, Charity fundraiser, for surgery scheduling.   Patient stated that she is extremely hard to get in touch with due to her job, but is off work today. Patient is requesting a return call today regarding scheduling to make sure she is able to be reached.

## 2021-01-27 NOTE — Telephone Encounter (Signed)
Call returned to patient.  Left detailed message, ok per dpr. Advised next available surgery date 02/21/21. Return call to office to further discuss.

## 2021-01-31 NOTE — Telephone Encounter (Addendum)
Spoke with patient. Reviewed surgery dates. Patient request to proceed with surgery on 02/22/21.  I will return call once surgery date and time confirmed. Patient verbalizes understanding and is agreeable.   Surgery request sent.

## 2021-02-02 NOTE — Telephone Encounter (Signed)
Spoke with patient. Surgery date request confirmed.  Advised surgery is scheduled for 02/22/21, [facility] at 0730.  Surgery instruction sheet and hospital brochure reviewed, printed copy will be mailed.  Patient advised if Covid screening and quarantine requirements and agreeable.   Routing to provider. Encounter closed.  Cc: Hayley Carder

## 2021-02-09 NOTE — Progress Notes (Signed)
GYNECOLOGY  VISIT   HPI: 59 y.o.   Single  Caucasian  female   G2P0002 with Patient's last menstrual period was 05/15/2016 (approximate).   here for pre op exam.   Patient has postmenopausal bleeding on HRT.  Still bleeding.  Having various levels of cramping.   Pelvic US  Uterus 10.46 x 6.5 x 4.3 cm. 3.5 x 2.7 cm pedunculated fibroid near right adnexa. EMS 5.04 mm, irregular with feeder vessel. 3D imaging suggesting endometrial mass.  Ovaries atrophic. No free fluid.  EMB showed benign polyp.  Off Trintellix due to nausea.  She may choose to restart Cymbalta.  She likes the Gabapentin to reduce night time hot flashes.   Is a court recorder.   GYNECOLOGIC HISTORY: Patient's last menstrual period was 05/15/2016 (approximate). Contraception:  PMP Menopausal hormone therapy:  Estrace, Progesterone Last mammogram:  01-16-20 Neg/BiRads1 Last pap smear: 06-11-20 ASCUS:Pos HR HPV, 11-11-18 Neg:Neg HR HPV, 09-27-17 Neg:Neg HR HPV        OB History     Gravida  2   Para  2   Term  0   Preterm  0   AB  0   Living  2      SAB  0   IAB  0   Ectopic  0   Multiple  0   Live Births  0              Patient Active Problem List   Diagnosis Date Noted   Stress incontinence 09/29/2017   Menopausal symptoms 09/29/2017    Past Medical History:  Diagnosis Date   Anxiety    Depression    Fibromyalgia    Herpes simplex type 2 infection 2020   Hormone disorder    Hyperlipidemia    Infertility, female    Lactose intolerance    Ovarian hyperstimulation syndrome    PCOS (polycystic ovarian syndrome)    Urinary incontinence     Past Surgical History:  Procedure Laterality Date   ABDOMINAL SURGERY  2004   BREAST REDUCTION SURGERY Bilateral 2004   COLPOSCOPY     COSMETIC SURGERY     DILATION AND CURETTAGE OF UTERUS     FERTILITY SURGERY     OOPHORECTOMY     REDUCTION MAMMAPLASTY Bilateral     Current Outpatient Medications  Medication Sig Dispense Refill    estradiol (ESTRACE) 1 MG tablet Take 1 tablet (1 mg total) by mouth daily. 30 tablet 1   gabapentin (NEURONTIN) 100 MG capsule Take one capsule (100 mg) by mouth nightly for one week.  Increase to two capsules (200 mg) by mouth nightly for one week.  Then increase to three capsules (300 mg) nightly. 90 capsule 1   medroxyPROGESTERone (PROVERA) 2.5 MG tablet Take 1 tablet (2.5 mg total) by mouth daily. 30 tablet 1   Multiple Vitamin (MULTIVITAMIN) capsule Take 1 capsule by mouth daily.     valACYclovir (VALTREX) 500 MG tablet Take 1 tablet (500 mg total) by mouth daily. Take as prevention.  Take one tablet by mouth twice daily for 3 days for an outbreak. 40 tablet 11   ALPRAZolam (XANAX) 0.5 MG tablet Take 0.5 mg by mouth 2 (two) times daily as needed. (Patient not taking: Reported on 02/10/2021)     busPIRone (BUSPAR) 15 MG tablet Take 1 tablet by mouth 2 (two) times daily. (Patient not taking: Reported on 02/10/2021)     DULoxetine (CYMBALTA) 60 MG capsule Take 1 capsule by mouth daily. (Patient not  taking: Reported on 02/10/2021)     No current facility-administered medications for this visit.     ALLERGIES: Patient has no known allergies.  Family History  Problem Relation Age of Onset   Depression Mother    Pancreatic cancer Mother    Diabetes Mother    Obesity Mother    Heart Problems Mother    Depression Father    Cancer Maternal Grandmother    Dementia Maternal Grandmother    Breast cancer Maternal Grandmother    ADD / ADHD Daughter     Social History   Socioeconomic History   Marital status: Single    Spouse name: Not on file   Number of children: Not on file   Years of education: Not on file   Highest education level: Not on file  Occupational History   Not on file  Tobacco Use   Smoking status: Never   Smokeless tobacco: Never  Vaping Use   Vaping Use: Never used  Substance and Sexual Activity   Alcohol use: Yes    Comment: 2 glasses of wine/month   Drug use:  Never   Sexual activity: Yes    Partners: Male    Birth control/protection: Post-menopausal  Other Topics Concern   Not on file  Social History Narrative   Not on file   Social Determinants of Health   Financial Resource Strain: Not on file  Food Insecurity: Not on file  Transportation Needs: Not on file  Physical Activity: Not on file  Stress: Not on file  Social Connections: Not on file  Intimate Partner Violence: Not on file    Review of Systems  Gastrointestinal:  Positive for diarrhea.  Genitourinary:        Bleeding Cramping   All other systems reviewed and are negative.  PHYSICAL EXAMINATION:    BP (!) 142/86 (BP Location: Right Arm, Patient Position: Sitting, Cuff Size: Normal)   Pulse 80   Resp 14   Ht 5' 5.5" (1.664 m)   Wt 190 lb (86.2 kg)   LMP 05/15/2016 (Approximate)   BMI 31.14 kg/m     General appearance: alert, cooperative and appears stated age Head: Normocephalic, without obvious abnormality, atraumatic Lungs: clear to auscultation bilaterally Extremities: extremities normal, atraumatic, no cyanosis or edema Neurologic: Grossly normal  Pelvic: Deferred.  ASSESSMENT  Postmenopausal bleeding. HRT.  Benign endometrial polyp.  Pedunculated fibroid.  PLAN  Discussion of hysteroscopy with Myosure polypectomy, dilation and curettage.  Risks, benefits, and alternatives reviewed. Risks include but are not limited to bleeding, infection, damage to surrounding organs including uterine perforation requiring hospitalization and laparoscopy, reaction to anesthesia, DVT, PE, death, need for further treatment and surgery. She understands I will not be able to remove the fibroid with the hysteroscopic surgery.  Patient wishes to proceed.  ACOG brochures on hysteroscopy and dilation and curettage.  An After Visit Summary was printed and given to the patient.  24 min  total time was spent for this patient encounter, including preparation, face-to-face  counseling with the patient, coordination of care, and documentation of the encounter.

## 2021-02-10 ENCOUNTER — Encounter: Payer: Self-pay | Admitting: Obstetrics and Gynecology

## 2021-02-10 ENCOUNTER — Other Ambulatory Visit: Payer: Self-pay

## 2021-02-10 ENCOUNTER — Ambulatory Visit: Payer: BC Managed Care – PPO | Admitting: Obstetrics and Gynecology

## 2021-02-10 VITALS — BP 142/86 | HR 80 | Resp 14 | Ht 65.5 in | Wt 190.0 lb

## 2021-02-10 DIAGNOSIS — N840 Polyp of corpus uteri: Secondary | ICD-10-CM | POA: Diagnosis not present

## 2021-02-10 DIAGNOSIS — Z79890 Hormone replacement therapy (postmenopausal): Secondary | ICD-10-CM | POA: Diagnosis not present

## 2021-02-10 DIAGNOSIS — N950 Postmenopausal bleeding: Secondary | ICD-10-CM | POA: Diagnosis not present

## 2021-02-15 ENCOUNTER — Other Ambulatory Visit: Payer: Self-pay | Admitting: Obstetrics and Gynecology

## 2021-02-15 NOTE — Telephone Encounter (Signed)
Spoke with patient and confirmed she is taking 3 Gabapentin 100 mg capsules nightly for 300 mg daily.

## 2021-02-15 NOTE — Telephone Encounter (Signed)
Post op visit 03/04/21. AEX 04/04/21.

## 2021-02-15 NOTE — Telephone Encounter (Signed)
Please confirm the dosage of gabapentin that the patient is taking so I can refill her prescription appropriately.   Thank you!

## 2021-02-16 ENCOUNTER — Encounter (HOSPITAL_BASED_OUTPATIENT_CLINIC_OR_DEPARTMENT_OTHER): Payer: Self-pay | Admitting: Obstetrics and Gynecology

## 2021-02-18 ENCOUNTER — Encounter (HOSPITAL_BASED_OUTPATIENT_CLINIC_OR_DEPARTMENT_OTHER): Payer: Self-pay | Admitting: Obstetrics and Gynecology

## 2021-02-18 ENCOUNTER — Other Ambulatory Visit: Payer: Self-pay

## 2021-02-18 ENCOUNTER — Encounter (HOSPITAL_COMMUNITY)
Admission: RE | Admit: 2021-02-18 | Discharge: 2021-02-18 | Disposition: A | Discharge status: Home / Self Care | Payer: BC Managed Care – PPO | Source: Ambulatory Visit | Attending: Obstetrics and Gynecology | Admitting: Obstetrics and Gynecology

## 2021-02-18 DIAGNOSIS — Z01812 Encounter for preprocedural laboratory examination: Secondary | ICD-10-CM | POA: Diagnosis not present

## 2021-02-18 LAB — CBC
HCT: 41.5 % (ref 36.0–46.0)
Hemoglobin: 13.4 g/dL (ref 12.0–15.0)
MCH: 31.5 pg (ref 26.0–34.0)
MCHC: 32.3 g/dL (ref 30.0–36.0)
MCV: 97.4 fL (ref 80.0–100.0)
Platelets: 301 10*3/uL (ref 150–400)
RBC: 4.26 MIL/uL (ref 3.87–5.11)
RDW: 12.8 % (ref 11.5–15.5)
WBC: 9 10*3/uL (ref 4.0–10.5)
nRBC: 0 % (ref 0.0–0.2)

## 2021-02-18 LAB — BASIC METABOLIC PANEL
Anion gap: 5 (ref 5–15)
BUN: 16 mg/dL (ref 6–20)
CO2: 27 mmol/L (ref 22–32)
Calcium: 9.7 mg/dL (ref 8.9–10.3)
Chloride: 114 mmol/L — ABNORMAL HIGH (ref 98–111)
Creatinine, Ser: 0.63 mg/dL (ref 0.44–1.00)
GFR, Estimated: >60 mL/min (ref >60)
Glucose, Bld: 106 mg/dL — ABNORMAL HIGH (ref 70–99)
Potassium: 4.1 mmol/L (ref 3.5–5.1)
Sodium: 146 mmol/L — ABNORMAL HIGH (ref 135–145)

## 2021-02-18 NOTE — Progress Notes (Signed)
Spoke w/ via phone for pre-op interview--- pt Lab needs dos----  no            Lab results------ pt had lab work done today, CBC/BMP results  COVID test -----patient states asymptomatic no test needed Arrive at ------- 0530 on 09-22-2020 NPO after MN NO Solid Food.  Clear liquids from MN until--- 0430 Med rec completed Medications to take morning of surgery ----- buspar, estadiol Diabetic medication ----- n/a Patient instructed no nail polish to be worn day of surgery Patient instructed to bring photo id and insurance card day of surgery Patient aware to have Driver (ride ) / caregiver  for 24 hours after surgery --sig other, mark konish Patient Special Instructions ----- n/a Pre-Op special Istructions ----- n/a Patient verbalized understanding of instructions that were given at this phone interview. Patient denies shortness of breath, chest pain, fever, cough at this phone interview.

## 2021-02-21 NOTE — Anesthesia Preprocedure Evaluation (Addendum)
Anesthesia Evaluation  Patient identified by MRN, date of birth, ID band Patient awake    Reviewed: Allergy & Precautions, NPO status , Patient's Chart, lab work & pertinent test results  Airway Mallampati: III  TM Distance: >3 FB Neck ROM: Full    Dental  (+) Teeth Intact, Dental Advisory Given   Pulmonary neg pulmonary ROS,    breath sounds clear to auscultation       Cardiovascular negative cardio ROS   Rhythm:Regular Rate:Normal     Neuro/Psych PSYCHIATRIC DISORDERS Anxiety negative neurological ROS     GI/Hepatic negative GI ROS, Neg liver ROS,   Endo/Other  negative endocrine ROS  Renal/GU negative Renal ROS     Musculoskeletal  (+) Fibromyalgia -  Abdominal Normal abdominal exam  (+) - obese,   Peds  Hematology negative hematology ROS (+)   Anesthesia Other Findings postmenopausal bleeding, endometrial polyp  Reproductive/Obstetrics                            Anesthesia Physical Anesthesia Plan  ASA: 2  Anesthesia Plan: General   Post-op Pain Management:    Induction: Intravenous  PONV Risk Score and Plan: 4 or greater and Ondansetron, Dexamethasone, Midazolam and Scopolamine patch - Pre-op  Airway Management Planned: LMA  Additional Equipment: None  Intra-op Plan:   Post-operative Plan: Extubation in OR  Informed Consent: I have reviewed the patients History and Physical, chart, labs and discussed the procedure including the risks, benefits and alternatives for the proposed anesthesia with the patient or authorized representative who has indicated his/her understanding and acceptance.     Dental advisory given  Plan Discussed with: CRNA  Anesthesia Plan Comments:         Anesthesia Quick Evaluation

## 2021-02-22 ENCOUNTER — Ambulatory Visit (HOSPITAL_BASED_OUTPATIENT_CLINIC_OR_DEPARTMENT_OTHER)
Admission: RE | Admit: 2021-02-22 | Discharge: 2021-02-22 | Disposition: A | Discharge status: Home / Self Care | Payer: BC Managed Care – PPO | Attending: Obstetrics and Gynecology | Admitting: Obstetrics and Gynecology

## 2021-02-22 ENCOUNTER — Encounter (HOSPITAL_BASED_OUTPATIENT_CLINIC_OR_DEPARTMENT_OTHER): Payer: Self-pay | Admitting: Obstetrics and Gynecology

## 2021-02-22 ENCOUNTER — Ambulatory Visit (HOSPITAL_BASED_OUTPATIENT_CLINIC_OR_DEPARTMENT_OTHER): Payer: BC Managed Care – PPO | Admitting: Anesthesiology

## 2021-02-22 ENCOUNTER — Encounter (HOSPITAL_BASED_OUTPATIENT_CLINIC_OR_DEPARTMENT_OTHER)
Admission: RE | Disposition: A | Discharge status: Home / Self Care | Payer: Self-pay | Source: Home / Self Care | Attending: Obstetrics and Gynecology

## 2021-02-22 DIAGNOSIS — Z79899 Other long term (current) drug therapy: Secondary | ICD-10-CM | POA: Insufficient documentation

## 2021-02-22 DIAGNOSIS — N950 Postmenopausal bleeding: Secondary | ICD-10-CM | POA: Insufficient documentation

## 2021-02-22 DIAGNOSIS — K219 Gastro-esophageal reflux disease without esophagitis: Secondary | ICD-10-CM | POA: Diagnosis not present

## 2021-02-22 DIAGNOSIS — N9489 Other specified conditions associated with female genital organs and menstrual cycle: Secondary | ICD-10-CM | POA: Diagnosis not present

## 2021-02-22 DIAGNOSIS — Z79890 Hormone replacement therapy (postmenopausal): Secondary | ICD-10-CM | POA: Insufficient documentation

## 2021-02-22 DIAGNOSIS — Z793 Long term (current) use of hormonal contraceptives: Secondary | ICD-10-CM | POA: Diagnosis not present

## 2021-02-22 DIAGNOSIS — E669 Obesity, unspecified: Secondary | ICD-10-CM | POA: Insufficient documentation

## 2021-02-22 DIAGNOSIS — Z8616 Personal history of COVID-19: Secondary | ICD-10-CM | POA: Diagnosis not present

## 2021-02-22 DIAGNOSIS — Z6830 Body mass index (BMI) 30.0-30.9, adult: Secondary | ICD-10-CM | POA: Insufficient documentation

## 2021-02-22 DIAGNOSIS — E282 Polycystic ovarian syndrome: Secondary | ICD-10-CM | POA: Insufficient documentation

## 2021-02-22 DIAGNOSIS — M797 Fibromyalgia: Secondary | ICD-10-CM | POA: Insufficient documentation

## 2021-02-22 DIAGNOSIS — E785 Hyperlipidemia, unspecified: Secondary | ICD-10-CM | POA: Diagnosis not present

## 2021-02-22 DIAGNOSIS — R32 Unspecified urinary incontinence: Secondary | ICD-10-CM | POA: Insufficient documentation

## 2021-02-22 DIAGNOSIS — N840 Polyp of corpus uteri: Secondary | ICD-10-CM | POA: Diagnosis not present

## 2021-02-22 HISTORY — DX: Generalized anxiety disorder: F41.1

## 2021-02-22 HISTORY — PX: DILATATION & CURETTAGE/HYSTEROSCOPY WITH MYOSURE: SHX6511

## 2021-02-22 HISTORY — DX: Postmenopausal bleeding: N95.0

## 2021-02-22 HISTORY — DX: Presence of spectacles and contact lenses: Z97.3

## 2021-02-22 HISTORY — DX: Personal history of other diseases of the female genital tract: Z87.42

## 2021-02-22 HISTORY — DX: Polyp of corpus uteri: N84.0

## 2021-02-22 HISTORY — DX: Stress incontinence (female) (male): N39.3

## 2021-02-22 HISTORY — DX: Nontoxic multinodular goiter: E04.2

## 2021-02-22 HISTORY — DX: Personal history of other benign neoplasm: Z86.018

## 2021-02-22 HISTORY — DX: Gastro-esophageal reflux disease without esophagitis: K21.9

## 2021-02-22 SURGERY — DILATATION & CURETTAGE/HYSTEROSCOPY WITH MYOSURE
Anesthesia: General | Site: Vagina

## 2021-02-22 MED ORDER — MIDAZOLAM HCL 5 MG/5ML IJ SOLN
INTRAMUSCULAR | Status: DC | PRN
  Administered 2021-02-22: 2 mg via INTRAVENOUS

## 2021-02-22 MED ORDER — PHENYLEPHRINE 40 MCG/ML (10ML) SYRINGE FOR IV PUSH (FOR BLOOD PRESSURE SUPPORT)
PREFILLED_SYRINGE | INTRAVENOUS | Status: DC | PRN
  Administered 2021-02-22: 80 ug via INTRAVENOUS

## 2021-02-22 MED ORDER — GABAPENTIN 300 MG PO CAPS
300.0000 mg | ORAL_CAPSULE | ORAL | Status: AC
  Administered 2021-02-22: 300 mg via ORAL

## 2021-02-22 MED ORDER — PROPOFOL 10 MG/ML IV BOLUS
INTRAVENOUS | Status: DC | PRN
  Administered 2021-02-22: 170 mg via INTRAVENOUS

## 2021-02-22 MED ORDER — POVIDONE-IODINE 10 % EX SWAB: 2.0000 "application " | Freq: Once | CUTANEOUS | Status: DC

## 2021-02-22 MED ORDER — FENTANYL CITRATE (PF) 100 MCG/2ML IJ SOLN
INTRAMUSCULAR | Status: DC | PRN
  Administered 2021-02-22: 50 ug via INTRAVENOUS

## 2021-02-22 MED ORDER — ACETAMINOPHEN 500 MG PO TABS
ORAL_TABLET | ORAL | Status: AC
  Filled 2021-02-22: qty 2

## 2021-02-22 MED ORDER — ACETAMINOPHEN 160 MG/5ML PO SOLN: 325.0000 mg | ORAL | Status: DC | PRN

## 2021-02-22 MED ORDER — GABAPENTIN 300 MG PO CAPS
ORAL_CAPSULE | ORAL | Status: AC
  Filled 2021-02-22: qty 1

## 2021-02-22 MED ORDER — PROPOFOL 10 MG/ML IV BOLUS
INTRAVENOUS | Status: AC
  Filled 2021-02-22: qty 20

## 2021-02-22 MED ORDER — LACTATED RINGERS IV SOLN: INTRAVENOUS | Status: DC

## 2021-02-22 MED ORDER — FENTANYL CITRATE (PF) 100 MCG/2ML IJ SOLN
INTRAMUSCULAR | Status: AC
  Filled 2021-02-22: qty 2

## 2021-02-22 MED ORDER — IBUPROFEN 800 MG PO TABS: 800.0000 mg | ORAL_TABLET | Freq: Three times a day (TID) | ORAL | 0 refills | Status: DC | PRN

## 2021-02-22 MED ORDER — METOCLOPRAMIDE HCL 5 MG/ML IJ SOLN
INTRAMUSCULAR | Status: AC
  Filled 2021-02-22: qty 2

## 2021-02-22 MED ORDER — DIPHENHYDRAMINE HCL 50 MG/ML IJ SOLN
INTRAMUSCULAR | Status: DC | PRN
  Administered 2021-02-22: 6.25 mg via INTRAVENOUS

## 2021-02-22 MED ORDER — FENTANYL CITRATE (PF) 100 MCG/2ML IJ SOLN: 25.0000 ug | INTRAMUSCULAR | Status: DC | PRN

## 2021-02-22 MED ORDER — OXYCODONE HCL 5 MG PO TABS
ORAL_TABLET | ORAL | Status: AC
  Filled 2021-02-22: qty 1

## 2021-02-22 MED ORDER — PHENYLEPHRINE 40 MCG/ML (10ML) SYRINGE FOR IV PUSH (FOR BLOOD PRESSURE SUPPORT)
PREFILLED_SYRINGE | INTRAVENOUS | Status: AC
  Filled 2021-02-22: qty 10

## 2021-02-22 MED ORDER — KETOROLAC TROMETHAMINE 30 MG/ML IJ SOLN
INTRAMUSCULAR | Status: AC
  Filled 2021-02-22: qty 1

## 2021-02-22 MED ORDER — DEXAMETHASONE SODIUM PHOSPHATE 10 MG/ML IJ SOLN
INTRAMUSCULAR | Status: DC | PRN
  Administered 2021-02-22: 10 mg via INTRAVENOUS

## 2021-02-22 MED ORDER — ACETAMINOPHEN 325 MG PO TABS: 325.0000 mg | ORAL_TABLET | ORAL | Status: DC | PRN

## 2021-02-22 MED ORDER — LIDOCAINE HCL 1 % IJ SOLN
INTRAMUSCULAR | Status: DC | PRN
  Administered 2021-02-22: 10 mL

## 2021-02-22 MED ORDER — ONDANSETRON HCL 4 MG/2ML IJ SOLN
INTRAMUSCULAR | Status: AC
  Filled 2021-02-22: qty 2

## 2021-02-22 MED ORDER — MIDAZOLAM HCL 2 MG/2ML IJ SOLN
INTRAMUSCULAR | Status: AC
  Filled 2021-02-22: qty 2

## 2021-02-22 MED ORDER — OXYCODONE HCL 5 MG PO TABS
5.0000 mg | ORAL_TABLET | Freq: Once | ORAL | Status: AC | PRN
  Administered 2021-02-22: 5 mg via ORAL

## 2021-02-22 MED ORDER — SODIUM CHLORIDE 0.9 % IR SOLN
Status: DC | PRN
  Administered 2021-02-22: 500 mL

## 2021-02-22 MED ORDER — LIDOCAINE 2% (20 MG/ML) 5 ML SYRINGE
INTRAMUSCULAR | Status: DC | PRN
  Administered 2021-02-22: 100 mg via INTRAVENOUS

## 2021-02-22 MED ORDER — LIDOCAINE 2% (20 MG/ML) 5 ML SYRINGE
INTRAMUSCULAR | Status: AC
  Filled 2021-02-22: qty 5

## 2021-02-22 MED ORDER — ONDANSETRON HCL 4 MG/2ML IJ SOLN
INTRAMUSCULAR | Status: DC | PRN
  Administered 2021-02-22: 4 mg via INTRAVENOUS

## 2021-02-22 MED ORDER — ACETAMINOPHEN 500 MG PO TABS
1000.0000 mg | ORAL_TABLET | ORAL | Status: AC
  Administered 2021-02-22: 1000 mg via ORAL

## 2021-02-22 MED ORDER — OXYCODONE HCL 5 MG/5ML PO SOLN: 5.0000 mg | Freq: Once | ORAL | Status: AC | PRN

## 2021-02-22 MED ORDER — DIPHENHYDRAMINE HCL 50 MG/ML IJ SOLN
INTRAMUSCULAR | Status: AC
  Filled 2021-02-22: qty 1

## 2021-02-22 MED ORDER — DEXAMETHASONE SODIUM PHOSPHATE 10 MG/ML IJ SOLN
INTRAMUSCULAR | Status: AC
  Filled 2021-02-22: qty 1

## 2021-02-22 MED ORDER — AMISULPRIDE (ANTIEMETIC) 5 MG/2ML IV SOLN: 10.0000 mg | Freq: Once | INTRAVENOUS | Status: DC | PRN

## 2021-02-22 MED ORDER — KETOROLAC TROMETHAMINE 30 MG/ML IJ SOLN
INTRAMUSCULAR | Status: DC | PRN
Start: 2021-02-22 — End: 2021-02-22
  Administered 2021-02-22: 30 mg via INTRAVENOUS

## 2021-02-22 MED ORDER — ARTIFICIAL TEARS OPHTHALMIC OINT
TOPICAL_OINTMENT | OPHTHALMIC | Status: AC
  Filled 2021-02-22: qty 3.5

## 2021-02-22 MED ORDER — PROMETHAZINE HCL 25 MG/ML IJ SOLN: 6.2500 mg | INTRAMUSCULAR | Status: DC | PRN

## 2021-02-22 MED ORDER — METOCLOPRAMIDE HCL 5 MG/ML IJ SOLN
INTRAMUSCULAR | Status: DC | PRN
  Administered 2021-02-22: 10 mg via INTRAVENOUS

## 2021-02-22 MED ORDER — ACETAMINOPHEN 10 MG/ML IV SOLN: 1000.0000 mg | Freq: Once | INTRAVENOUS | Status: DC | PRN

## 2021-02-22 SURGICAL SUPPLY — 17 items
CATH ROBINSON RED A/P 16FR (CATHETERS) ×2 IMPLANT
DEVICE MYOSURE LITE (MISCELLANEOUS) ×2 IMPLANT
DEVICE MYOSURE REACH (MISCELLANEOUS) IMPLANT
DILATOR CANAL MILEX (MISCELLANEOUS) IMPLANT
GAUZE 4X4 16PLY ~~LOC~~+RFID DBL (SPONGE) ×2 IMPLANT
GLOVE SURG ENC MOIS LTX SZ6.5 (GLOVE) ×2 IMPLANT
GOWN STRL REUS W/TWL LRG LVL3 (GOWN DISPOSABLE) ×2 IMPLANT
IV NS IRRIG 3000ML ARTHROMATIC (IV SOLUTION) ×2 IMPLANT
KIT PROCEDURE FLUENT (KITS) ×2 IMPLANT
KIT TURNOVER CYSTO (KITS) ×2 IMPLANT
MYOSURE XL FIBROID (MISCELLANEOUS)
PACK VAGINAL MINOR WOMEN LF (CUSTOM PROCEDURE TRAY) ×2 IMPLANT
PAD OB MATERNITY 4.3X12.25 (PERSONAL CARE ITEMS) ×4 IMPLANT
SEAL CERVICAL OMNI LOK (ABLATOR) IMPLANT
SEAL ROD LENS SCOPE MYOSURE (ABLATOR) ×2 IMPLANT
SYSTEM TISS REMOVAL MYOSURE XL (MISCELLANEOUS) IMPLANT
TOWEL OR 17X26 10 PK STRL BLUE (TOWEL DISPOSABLE) ×2 IMPLANT

## 2021-02-22 NOTE — Progress Notes (Signed)
Update to History and Physical  No marked change in status since office preop visit.  Still bleeding off and on. Patient examined.  CBC and BMP reviewed. OK to proceed with surgery.

## 2021-02-22 NOTE — Transfer of Care (Signed)
Immediate Anesthesia Transfer of Care Note  Patient: Anna Chavez  Procedure(s) Performed: DILATATION & CURETTAGE/HYSTEROSCOPY WITH MYOSURE (Vagina )  Patient Location: PACU  Anesthesia Type:General  Level of Consciousness: drowsy, patient cooperative and responds to stimulation  Airway & Oxygen Therapy: Patient Spontanous Breathing  Post-op Assessment: Report given to RN and Post -op Vital signs reviewed and stable  Post vital signs: Reviewed and stable  Last Vitals:  Vitals Value Taken Time  BP 142/83 02/22/21 0807  Temp    Pulse 67 02/22/21 0809  Resp 10 02/22/21 0809  SpO2 95 % 02/22/21 0809  Vitals shown include unvalidated device data.  Last Pain:  Vitals:   02/22/21 0606  TempSrc: Oral  PainSc: 0-No pain      Patients Stated Pain Goal: 5 (02/22/21 0606)  Complications: No notable events documented.

## 2021-02-22 NOTE — Anesthesia Postprocedure Evaluation (Signed)
Anesthesia Post Note  Patient: Anna Chavez  Procedure(s) Performed: DILATATION & CURETTAGE/HYSTEROSCOPY WITH MYOSURE (Vagina )     Patient location during evaluation: PACU Anesthesia Type: General Level of consciousness: awake and alert Pain management: pain level controlled Vital Signs Assessment: post-procedure vital signs reviewed and stable Respiratory status: spontaneous breathing, nonlabored ventilation, respiratory function stable and patient connected to nasal cannula oxygen Cardiovascular status: blood pressure returned to baseline and stable Postop Assessment: no apparent nausea or vomiting Anesthetic complications: no   No notable events documented.  Last Vitals:  Vitals:   02/22/21 0900 02/22/21 0915  BP: 120/70 130/75  Pulse: 70 65  Resp: 13 (!) 9  Temp:    SpO2: 96% 97%    Last Pain:  Vitals:   02/22/21 0915  TempSrc:   PainSc: 3                  [name]

## 2021-02-22 NOTE — Op Note (Signed)
OPERATIVE REPORT   PREOPERATIVE DIAGNOSES:   Postmenopausal bleeding, endometrial polyp  POSTOPERATIVE DIAGNOSES:   Postmenopausal bleeding, endometrial polyp  PROCEDURE:  Hysteroscopy with dilation and curettage and Myosure resection of endometrial polyp  SURGEON:  Randye Lobo, MD  ANESTHESIA:  LMA, paracervical block with 10 mL of 1% lidocaine.  IV FLUIDS:  600 cc LR  EBL:   5 cc  URINE OUTPUT:   0 cc  NORMAL SALINE DEFICIT:    45 cc  COMPLICATIONS:  None.  INDICATIONS FOR THE PROCEDURE:     The patient is a 60 year old G2P2 Caucasian female on hormone replacement therapy, who presents with postmenopausal bleeding.   Pelvic ultrasound showed an irregular endometrium 5.04 mm with a feeder vessel.   Endometrial biopsy revealed a benign polyp. A plan is now made to proceed with a hysteroscopy with dilation and curettage and Myosurgical resection of endometrial polyp; after risks, benefits and alternatives were reviewed.  FINDINGS:  Exam under anesthesia revealed a small anteverted, mobile uterus.  No adnexal masses were noted.  The uterus was sounded to 8 cm. Hysteroscopy showed a 7 mm sessile polyp of the posterior fundus The tubal ostia regions were normal.  Endometrial currettings were scant  SPECIMENS:   endometrial polyp and endometrial curettings were sent to pathology separately.   PROCEDURE IN DETAIL:  The patient was reidentified in the preoperative hold area.  She received TED hose and PAS stockings for DVT prophylaxis.  In the operating room, the patient was placed in the dorsal lithotomy position and then an LMA anesthetic was introduced.  The patient's lower abdomen, vagina and perineum were sterilely prepped with Betadine and the  patient's bladder was catheterized of urine.  She was sterilly draped  An exam under anesthesia was performed.  A speculum was placed inside the vagina and a single-tooth tenaculum was placed on the anterior cervical lip.  A  paracervical block was performed with a total of 10 mL of 1% lidocaine plain.  The uterus was sounded. The cervix was dilated to a #21 Pratt dilator.  The MyoSure hysteroscope was then inserted inside the uterine cavity under the continuous infusion of normal saline solution.   Findings are as noted above.  The Myosure Lite device was used to resect the endometrial polyp without difficulty. The specimen was sent to Pathology.  The MyoSure hysteroscope was removed after the polyp was resected.  The cervix was further dilated to a #23 Pratt dilator.   The serrated and then sharp curettes were introduced into the uterine cavity and the endometrium was curetted in all 4 quadrants.   A scant amount of endometrial curettings was obtained.  This specimen was sent to Pathology.  The single-tooth tenaculum which had been placed on the anterior cervical lip was removed.     Hemostasis was good, and all of the vaginal instruments were removed.  The patient was awakened and escorted to the recovery room in stable condition after she was cleansed of Betadine.  There were no complications to the procedure.   All needle, instrument and sponge counts were correct.  Randye Lobo, MD

## 2021-02-22 NOTE — Anesthesia Procedure Notes (Signed)
Procedure Name: LMA Insertion Date/Time: 02/22/2021 7:32 AM Performed by: Bishop Limbo, CRNA Pre-anesthesia Checklist: Patient identified, Emergency Drugs available, Suction available and Patient being monitored Patient Re-evaluated:Patient Re-evaluated prior to induction Oxygen Delivery Method: Circle System Utilized Preoxygenation: Pre-oxygenation with 100% oxygen Induction Type: IV induction Ventilation: Mask ventilation without difficulty LMA: LMA inserted LMA Size: 4.0 Number of attempts: 1 Airway Equipment and Method: Bite block Placement Confirmation: positive ETCO2 Tube secured with: Tape Dental Injury: Teeth and Oropharynx as per pre-operative assessment

## 2021-02-22 NOTE — H&P (Signed)
Office Visit 02/10/2021 [facility], Forrestine Him, MD Obstetrics and Gynecology Postmenopausal bleeding +2 more Dx Pre-op Exam; Referred by Macy Mis, MD Reason for Visit   Additional Documentation  Vitals:  BP 142/86 Important   (BP Location: Right Arm, Patient Position: Sitting, Cuff Size: Normal) Pulse 80 Resp 14 Ht 5' 5.5" (1.664 m) Wt 86.2 kg LMP 05/15/2016 (Approximate) BMI 31.14 kg/m BSA 2 m  More Vitals  Flowsheets:  NEWS, MEWS Score, Anthropometrics, Method of Visit   Encounter Info:  Billing Info, History, Allergies, Detailed Report    All Notes    Progress Notes by Patton Salles, MD at 02/10/2021 4:15 PM  Author: Patton Salles, MD Author Type: Physician Filed: 02/11/2021  2:39 PM  Note Status: Signed Cosign: Cosign Not Required Encounter Date: 02/10/2021  Editor: Patton Salles, MD (Physician)      Prior Versions: 1. Jetta Lout, RN (Registered Nurse) at 02/10/2021  4:19 PM - Sign when Signing Visit   2. Alphonsa Overall, CMA (Certified Engineer, site) at 02/09/2021  2:37 PM - Sign when Signing Visit    GYNECOLOGY  VISIT   HPI: 60 y.o.   Single  Caucasian  female   G2P0002 with Patient's last menstrual period was 05/15/2016 (approximate).   here for pre op exam.    Patient has postmenopausal bleeding on HRT.  Still bleeding.  Having various levels of cramping.    Pelvic US  Uterus 10.46 x 6.5 x 4.3 cm. 3.5 x 2.7 cm pedunculated fibroid near right adnexa. EMS 5.04 mm, irregular with feeder vessel. 3D imaging suggesting endometrial mass.  Ovaries atrophic. No free fluid.   EMB showed benign polyp.   Off Trintellix due to nausea.  She may choose to restart Cymbalta.   She likes the Gabapentin to reduce night time hot flashes.    Is a court recorder.    GYNECOLOGIC HISTORY: Patient's last menstrual period was 05/15/2016 (approximate). Contraception:   PMP Menopausal hormone therapy:  Estrace, Progesterone Last mammogram:  01-16-20 Neg/BiRads1 Last pap smear: 06-11-20 ASCUS:Pos HR HPV, 11-11-18 Neg:Neg HR HPV, 09-27-17 Neg:Neg HR HPV        OB History       Gravida  2   Para  2   Term  0   Preterm  0   AB  0   Living  2        SAB  0   IAB  0   Ectopic  0   Multiple  0   Live Births  0                     Patient Active Problem List    Diagnosis Date Noted   Stress incontinence 09/29/2017   Menopausal symptoms 09/29/2017          Past Medical History:  Diagnosis Date   Anxiety     Depression     Fibromyalgia     Herpes simplex type 2 infection 2020   Hormone disorder     Hyperlipidemia     Infertility, female     Lactose intolerance     Ovarian hyperstimulation syndrome     PCOS (polycystic ovarian syndrome)     Urinary incontinence             Past Surgical History:  Procedure Laterality Date   ABDOMINAL SURGERY   2004   BREAST REDUCTION SURGERY  Bilateral 2004   COLPOSCOPY       COSMETIC SURGERY       DILATION AND CURETTAGE OF UTERUS       FERTILITY SURGERY       OOPHORECTOMY       REDUCTION MAMMAPLASTY Bilateral              Current Outpatient Medications  Medication Sig Dispense Refill   estradiol (ESTRACE) 1 MG tablet Take 1 tablet (1 mg total) by mouth daily. 30 tablet 1   gabapentin (NEURONTIN) 100 MG capsule Take one capsule (100 mg) by mouth nightly for one week.  Increase to two capsules (200 mg) by mouth nightly for one week.  Then increase to three capsules (300 mg) nightly. 90 capsule 1   medroxyPROGESTERone (PROVERA) 2.5 MG tablet Take 1 tablet (2.5 mg total) by mouth daily. 30 tablet 1   Multiple Vitamin (MULTIVITAMIN) capsule Take 1 capsule by mouth daily.       valACYclovir (VALTREX) 500 MG tablet Take 1 tablet (500 mg total) by mouth daily. Take as prevention.  Take one tablet by mouth twice daily for 3 days for an outbreak. 40 tablet 11   ALPRAZolam (XANAX) 0.5 MG tablet  Take 0.5 mg by mouth 2 (two) times daily as needed. (Patient not taking: Reported on 02/10/2021)       busPIRone (BUSPAR) 15 MG tablet Take 1 tablet by mouth 2 (two) times daily. (Patient not taking: Reported on 02/10/2021)       DULoxetine (CYMBALTA) 60 MG capsule Take 1 capsule by mouth daily. (Patient not taking: Reported on 02/10/2021)        No current facility-administered medications for this visit.      ALLERGIES: Patient has no known allergies.        Family History  Problem Relation Age of Onset   Depression Mother     Pancreatic cancer Mother     Diabetes Mother     Obesity Mother     Heart Problems Mother     Depression Father     Cancer Maternal Grandmother     Dementia Maternal Grandmother     Breast cancer Maternal Grandmother     ADD / ADHD Daughter        Social History         Socioeconomic History   Marital status: Single      Spouse name: Not on file   Number of children: Not on file   Years of education: Not on file   Highest education level: Not on file  Occupational History   Not on file  Tobacco Use   Smoking status: Never   Smokeless tobacco: Never  Vaping Use   Vaping Use: Never used  Substance and Sexual Activity   Alcohol use: Yes      Comment: 2 glasses of wine/month   Drug use: Never   Sexual activity: Yes      Partners: Male      Birth control/protection: Post-menopausal  Other Topics Concern   Not on file  Social History Narrative   Not on file    Social Determinants of Health    Financial Resource Strain: Not on file  Food Insecurity: Not on file  Transportation Needs: Not on file  Physical Activity: Not on file  Stress: Not on file  Social Connections: Not on file  Intimate Partner Violence: Not on file      Review of Systems  Gastrointestinal:  Positive for diarrhea.  Genitourinary:        Bleeding Cramping   All other systems reviewed and are negative.   PHYSICAL EXAMINATION:     BP (!) 142/86 (BP Location:  Right Arm, Patient Position: Sitting, Cuff Size: Normal)   Pulse 80   Resp 14   Ht 5' 5.5" (1.664 m)   Wt 190 lb (86.2 kg)   LMP 05/15/2016 (Approximate)   BMI 31.14 kg/m     General appearance: alert, cooperative and appears stated age Head: Normocephalic, without obvious abnormality, atraumatic Lungs: clear to auscultation bilaterally Extremities: extremities normal, atraumatic, no cyanosis or edema Neurologic: Grossly normal   Pelvic: Deferred.   ASSESSMENT   Postmenopausal bleeding. HRT.  Benign endometrial polyp.  Pedunculated fibroid.   PLAN   Discussion of hysteroscopy with Myosure polypectomy, dilation and curettage.  Risks, benefits, and alternatives reviewed. Risks include but are not limited to bleeding, infection, damage to surrounding organs including uterine perforation requiring hospitalization and laparoscopy, reaction to anesthesia, DVT, PE, death, need for further treatment and surgery. She understands I will not be able to remove the fibroid with the hysteroscopic surgery.   Patient wishes to proceed.   ACOG brochures on hysteroscopy and dilation and curettage.   An After Visit Summary was printed and given to the patient.   24 min  total time was spent for this patient encounter, including preparation, face-to-face counseling with the patient, coordination of care, and documentation of the encounter.

## 2021-02-22 NOTE — Discharge Instructions (Addendum)
Hello Yardley,   I found and removed a polyp and did the curettage also.  Everything went well!  Conley Simmonds, MD    Post Anesthesia Home Care Instructions  Activity: Get plenty of rest for the remainder of the day. A responsible individual must stay with you for 24 hours following the procedure.  For the next 24 hours, DO NOT: -Drive a car -Advertising copywriter -Drink alcoholic beverages -Take any medication unless instructed by your physician -Make any legal decisions or sign important papers.  Meals: Start with liquid foods such as gelatin or soup. Progress to regular foods as tolerated. Avoid greasy, spicy, heavy foods. If nausea and/or vomiting occur, drink only clear liquids until the nausea and/or vomiting subsides. Call your physician if vomiting continues.  Special Instructions/Symptoms: Your throat may feel dry or sore from the anesthesia or the breathing tube placed in your throat during surgery. If this causes discomfort, gargle with warm salt water. The discomfort should disappear within 24 hours.  If you had a scopolamine patch placed behind your ear for the management of post- operative nausea and/or vomiting:  1. The medication in the patch is effective for 72 hours, after which it should be removed.  Wrap patch in a tissue and discard in the trash. Wash hands thoroughly with soap and water. 2. You may remove the patch earlier than 72 hours if you experience unpleasant side effects which may include dry mouth, dizziness or visual disturbances. 3. Avoid touching the patch. Wash your hands with soap and water after contact with the patch.

## 2021-02-23 ENCOUNTER — Encounter (HOSPITAL_BASED_OUTPATIENT_CLINIC_OR_DEPARTMENT_OTHER): Payer: Self-pay | Admitting: Obstetrics and Gynecology

## 2021-02-23 LAB — SURGICAL PATHOLOGY

## 2021-03-01 ENCOUNTER — Encounter: Payer: BC Managed Care – PPO | Admitting: Obstetrics and Gynecology

## 2021-03-02 ENCOUNTER — Ambulatory Visit (INDEPENDENT_AMBULATORY_CARE_PROVIDER_SITE_OTHER): Payer: BC Managed Care – PPO | Admitting: Obstetrics and Gynecology

## 2021-03-02 ENCOUNTER — Encounter: Payer: Self-pay | Admitting: Obstetrics and Gynecology

## 2021-03-02 ENCOUNTER — Other Ambulatory Visit: Payer: Self-pay

## 2021-03-02 VITALS — BP 122/62 | HR 80 | Ht 64.0 in | Wt 183.0 lb

## 2021-03-02 DIAGNOSIS — Z9889 Other specified postprocedural states: Secondary | ICD-10-CM

## 2021-03-02 MED ORDER — ESTRADIOL 1 MG PO TABS: 1.0000 mg | ORAL_TABLET | Freq: Every day | ORAL | 0 refills | Status: DC

## 2021-03-02 NOTE — Progress Notes (Signed)
GYNECOLOGY  VISIT   HPI: 60 y.o.   Single  Caucasian  female   G2P0002 with Patient's last menstrual period was 05/15/2016 (approximate).   here for 1 week status post DILATATION & CURETTAGE/HYSTEROSCOPY WITH MYOSURE (Vagina ) Procedure.  Pathology:  benign endometrial polyp and inactive endometrium.   Day three post op had cramping and bleeding for 4 - 5 days.  She did forget to take her hormones one night.   Patient is tired of gynecologic issues her entire life.   She is taking Valtrex.   She is taking estradiol 1 mg twice day. Provera 2.5 mg once daily.  She would like to be off HRT.  Gabapentin 100 mg, 3 per night.   Off Cymbalta.   GYNECOLOGIC HISTORY: Patient's last menstrual period was 05/15/2016 (approximate). Contraception:  PMP Menopausal hormone therapy:  Estrace, Progesterone Last mammogram:  01-16-20 Neg/BiRads1 Last pap smear: 06-11-20 ASCUS:Pos HR HPV, 11-11-18 Neg:Neg HR HPV, 09-27-17 Neg:Neg HR HPV        OB History     Gravida  2   Para  2   Term  0   Preterm  0   AB  0   Living  2      SAB  0   IAB  0   Ectopic  0   Multiple  0   Live Births  0              Patient Active Problem List   Diagnosis Date Noted   Stress incontinence 09/29/2017   Menopausal symptoms 09/29/2017    Past Medical History:  Diagnosis Date   Depression    Endometrial polyp    Fibromyalgia    GAD (generalized anxiety disorder)    GERD (gastroesophageal reflux disease)    Herpes simplex type 2 infection 2020   History of abnormal cervical Pap smear    History of benign ovarian tumor    age 39s   History of COVID-19 05/12/2019   positive result in epic;  per pt moderate symptoms with pneumonia, no oxygen, daily home health nurse care , mostly resolved in 6 wks then residual's resolved   History of infertility, female    Hyperlipidemia    Lactose intolerance    Multiple thyroid nodules    ultraound in epic 06-03-2020 bilateral nodules did not meet  critiria to bx   Ovarian hyperstimulation syndrome    PCOS (polycystic ovarian syndrome)    PMB (postmenopausal bleeding)    SUI (stress urinary incontinence, female)    Wears glasses     Past Surgical History:  Procedure Laterality Date   BREAST REDUCTION SURGERY Bilateral 2004   COLPOSCOPY     DILATATION & CURETTAGE/HYSTEROSCOPY WITH MYOSURE N/A 02/22/2021   Procedure: DILATATION & CURETTAGE/HYSTEROSCOPY WITH MYOSURE;  Surgeon: Patton Salles, MD;  Location: [facility] [place];  Service: Gynecology;  Laterality: N/A;   DILATION AND CURETTAGE OF UTERUS     teen   FERTILITY SURGERY     per pt multiple infertility surgery's via laparosocpy's/ laparotomy's,  last one approx age 82s    Current Outpatient Medications  Medication Sig Dispense Refill   ALPRAZolam (XANAX) 0.5 MG tablet Take 0.5 mg by mouth 2 (two) times daily as needed.     APPLE CIDER VINEGAR PO Take by mouth 5 (five) times daily. One gummy 5 times weekly, GOLI brand     busPIRone (BUSPAR) 15 MG tablet Take 15 mg by mouth 2 (two) times daily.  calcium carbonate (TUMS - DOSED IN MG ELEMENTAL CALCIUM) 500 MG chewable tablet Chew 1 tablet by mouth as needed for indigestion or heartburn.     Coenzyme Q10 (COQ-10 PO) Take by mouth daily.     gabapentin (NEURONTIN) 300 MG capsule Take 3 capsules (300 mg) nightly. (Patient taking differently: Take 300 mg by mouth at bedtime. Take 3 capsules (100 mg) nightly.) 90 capsule 1   ibuprofen (ADVIL) 800 MG tablet Take 1 tablet (800 mg total) by mouth every 8 (eight) hours as needed. 30 tablet 0   medroxyPROGESTERone (PROVERA) 2.5 MG tablet Take 1 tablet (2.5 mg total) by mouth daily. (Patient taking differently: Take 2.5 mg by mouth at bedtime.) 30 tablet 1   Multiple Vitamin (MULTIVITAMIN) capsule Take 1 capsule by mouth daily.     valACYclovir (VALTREX) 500 MG tablet Take 1 tablet (500 mg total) by mouth daily. Take as prevention.  Take one tablet by mouth twice  daily for 3 days for an outbreak. (Patient taking differently: Take 500 mg by mouth at bedtime. Take as prevention.  Take one tablet by mouth twice daily for 3 days for an outbreak.) 40 tablet 11   estradiol (ESTRACE) 1 MG tablet Take 1 tablet (1 mg total) by mouth daily. 30 tablet 0   No current facility-administered medications for this visit.     ALLERGIES: Patient has no known allergies.  Family History  Problem Relation Age of Onset   Depression Mother    Pancreatic cancer Mother    Diabetes Mother    Obesity Mother    Heart Problems Mother    Depression Father    Cancer Maternal Grandmother    Dementia Maternal Grandmother    Breast cancer Maternal Grandmother    ADD / ADHD Daughter     Social History   Socioeconomic History   Marital status: Single    Spouse name: Not on file   Number of children: Not on file   Years of education: Not on file   Highest education level: Not on file  Occupational History   Not on file  Tobacco Use   Smoking status: Never   Smokeless tobacco: Never  Vaping Use   Vaping Use: Never used  Substance and Sexual Activity   Alcohol use: Yes    Comment: occasional   Drug use: Never   Sexual activity: Yes    Partners: Male    Birth control/protection: Post-menopausal  Other Topics Concern   Not on file  Social History Narrative   Not on file   Social Determinants of Health   Financial Resource Strain: Not on file  Food Insecurity: Not on file  Transportation Needs: Not on file  Physical Activity: Not on file  Stress: Not on file  Social Connections: Not on file  Intimate Partner Violence: Not on file    Review of Systems  All other systems reviewed and are negative.  PHYSICAL EXAMINATION:    BP 122/62   Pulse 80   Ht 5\' 4"  (1.626 m)   Wt 183 lb (83 kg)   LMP 05/15/2016 (Approximate)   BMI 31.41 kg/m     General appearance: alert, cooperative and appears stated age  Pelvic: External genitalia:  no lesions               Urethra:  normal appearing urethra with no masses, tenderness or lesions              Bartholins and Skenes: normal  Vagina: normal appearing vagina with normal color and discharge, no lesions              Cervix: no lesions                Bimanual Exam:  Uterus:  normal size, contour, position, consistency, mobility, non-tender              Adnexa: no mass, fullness, tenderness               Chaperone was present for exam:  Marchelle Folks, CMA  ASSESSMENT  Status post hysteroscopy with polypectomy.  HRT.  Goal to wean off.   PLAN  Surgical findings, procedure, and pathology report reviewed.  Decrease Estrace to 1 mg daily. Continue Provera 2.5 mg daily. Continue gabapentin. Fu in one month for annual exam.    An After Visit Summary was printed and given to the patient.

## 2021-03-02 NOTE — Patient Instructions (Signed)
Please decrease your estradiol to 1 mg by mouth just once a day.

## 2021-03-04 ENCOUNTER — Encounter: Payer: BC Managed Care – PPO | Admitting: Obstetrics and Gynecology

## 2021-03-10 ENCOUNTER — Other Ambulatory Visit: Payer: Self-pay

## 2021-03-10 DIAGNOSIS — N9089 Other specified noninflammatory disorders of vulva and perineum: Secondary | ICD-10-CM

## 2021-03-10 MED ORDER — VALACYCLOVIR HCL 500 MG PO TABS: 500.0000 mg | ORAL_TABLET | Freq: Every day | ORAL | 0 refills | Status: DC

## 2021-03-10 NOTE — Telephone Encounter (Signed)
Last AEX 03/04/20. AEX scheduled 04/04/21.

## 2021-03-16 ENCOUNTER — Telehealth: Payer: Self-pay

## 2021-03-16 NOTE — Telephone Encounter (Signed)
Pharmacy requesting 90 days supply for patient's Estradiol 1mg  Rx that was recently prescribed for #30.  AEX is due 08/2021. Mammo 02/12/2020 neg. Past due and not scheduled.

## 2021-03-17 ENCOUNTER — Other Ambulatory Visit: Payer: Self-pay

## 2021-03-17 NOTE — Telephone Encounter (Signed)
My Chart message sent to patient asking her to schedule mammo as her last one was 02/12/20. Phone number provided for [facility].

## 2021-03-17 NOTE — Telephone Encounter (Signed)
Pharmacy informed AEX 11/21 and no 90 days supply at this time. Patient was sent #30 on 03/02/21.

## 2021-03-17 NOTE — Telephone Encounter (Signed)
Annual exam scheduled for 04/04/21.   Let me know if she does not have enough Estrace to get to this appointment.  We recently reduced her Estrace from 1 mg po bid to 1 mg po q day.   Please have her update her mammogram.

## 2021-03-29 ENCOUNTER — Telehealth: Payer: Self-pay

## 2021-03-29 NOTE — Telephone Encounter (Signed)
D&C Hysteroscopy on 02/22/21  Patient called in voice mail wanting to know date of procedure. She stated she is still having issues and has some questions.  I called her back and left message in voice mail to return the call.

## 2021-03-30 NOTE — Telephone Encounter (Signed)
Per [facility]PR access note on file I left message in voice mail letting patient know date of her surgery and asked her to call  me back to discuss her questions/concerns.

## 2021-03-30 NOTE — Telephone Encounter (Signed)
Patient called back in voice mail and thanked me for trying to reach her. SHe said she is at work and cannot talk about it. SHe said she has appt scheduled for next week and will discuss issues then.

## 2021-04-04 ENCOUNTER — Other Ambulatory Visit (HOSPITAL_COMMUNITY)
Admission: RE | Admit: 2021-04-04 | Discharge: 2021-04-04 | Disposition: A | Discharge status: Home / Self Care | Payer: BC Managed Care – PPO | Source: Ambulatory Visit | Attending: Obstetrics and Gynecology | Admitting: Obstetrics and Gynecology

## 2021-04-04 ENCOUNTER — Ambulatory Visit (INDEPENDENT_AMBULATORY_CARE_PROVIDER_SITE_OTHER): Payer: BC Managed Care – PPO | Admitting: Obstetrics and Gynecology

## 2021-04-04 ENCOUNTER — Other Ambulatory Visit: Payer: Self-pay

## 2021-04-04 ENCOUNTER — Encounter: Payer: Self-pay | Admitting: Obstetrics and Gynecology

## 2021-04-04 ENCOUNTER — Other Ambulatory Visit: Payer: Self-pay | Admitting: Obstetrics and Gynecology

## 2021-04-04 VITALS — BP 122/84 | HR 65 | Resp 20 | Ht 63.5 in | Wt 180.0 lb

## 2021-04-04 DIAGNOSIS — Z01419 Encounter for gynecological examination (general) (routine) without abnormal findings: Secondary | ICD-10-CM | POA: Diagnosis not present

## 2021-04-04 DIAGNOSIS — B009 Herpesviral infection, unspecified: Secondary | ICD-10-CM | POA: Diagnosis not present

## 2021-04-04 DIAGNOSIS — Z113 Encounter for screening for infections with a predominantly sexual mode of transmission: Secondary | ICD-10-CM | POA: Diagnosis present

## 2021-04-04 DIAGNOSIS — Z124 Encounter for screening for malignant neoplasm of cervix: Secondary | ICD-10-CM | POA: Insufficient documentation

## 2021-04-04 DIAGNOSIS — Z23 Encounter for immunization: Secondary | ICD-10-CM | POA: Diagnosis not present

## 2021-04-04 DIAGNOSIS — Z1231 Encounter for screening mammogram for malignant neoplasm of breast: Secondary | ICD-10-CM

## 2021-04-04 DIAGNOSIS — N9089 Other specified noninflammatory disorders of vulva and perineum: Secondary | ICD-10-CM

## 2021-04-04 MED ORDER — VALACYCLOVIR HCL 500 MG PO TABS: 500.0000 mg | ORAL_TABLET | Freq: Every day | ORAL | 3 refills | Status: DC

## 2021-04-04 MED ORDER — GABAPENTIN 300 MG PO CAPS: ORAL_CAPSULE | ORAL | 3 refills | Status: AC

## 2021-04-04 MED ORDER — MEDROXYPROGESTERONE ACETATE 2.5 MG PO TABS: 2.5000 mg | ORAL_TABLET | Freq: Every day | ORAL | 3 refills | Status: DC

## 2021-04-04 MED ORDER — ESTRADIOL 0.5 MG PO TABS: ORAL_TABLET | ORAL | 3 refills | Status: DC

## 2021-04-04 NOTE — Patient Instructions (Addendum)
Anna Chavez,   You may take the oral Estrace and Provera for 3 additional months, and then stop.   Let me know if you vaginal bleeding persists for than 6 more weeks.  I would then want you to call for an appointment so I can reassess.   Have a good Thanksgiving!  Conley Simmonds, MD   EXERCISE AND DIET:  We recommended that you start or continue a regular exercise program for good health. Regular exercise means any activity that makes your heart beat faster and makes you sweat.  We recommend exercising at least 30 minutes per day at least 3 days a week, preferably 4 or 5.  We also recommend a diet low in fat and sugar.  Inactivity, poor dietary choices and obesity can cause diabetes, heart attack, stroke, and kidney damage, among others.    ALCOHOL AND SMOKING:  Women should limit their alcohol intake to no more than 7 drinks/beers/glasses of wine (combined, not each!) per week. Moderation of alcohol intake to this level decreases your risk of breast cancer and liver damage. And of course, no recreational drugs are part of a healthy lifestyle.  And absolutely no smoking or even second hand smoke. Most people know smoking can cause heart and lung diseases, but did you know it also contributes to weakening of your bones? Aging of your skin?  Yellowing of your teeth and nails?  CALCIUM AND VITAMIN D:  Adequate intake of calcium and Vitamin D are recommended.  The recommendations for exact amounts of these supplements seem to change often, but generally speaking 600 mg of calcium (either carbonate or citrate) and 800 units of Vitamin D per day seems prudent. Certain women may benefit from higher intake of Vitamin D.  If you are among these women, your doctor will have told you during your visit.    PAP SMEARS:  Pap smears, to check for cervical cancer or precancers,  have traditionally been done yearly, although recent scientific advances have shown that most women can have pap smears less often.  However,  every woman still should have a physical exam from her gynecologist every year. It will include a breast check, inspection of the vulva and vagina to check for abnormal growths or skin changes, a visual exam of the cervix, and then an exam to evaluate the size and shape of the uterus and ovaries.  And after 58 years of age, a rectal exam is indicated to check for rectal cancers. We will also provide age appropriate advice regarding health maintenance, like when you should have certain vaccines, screening for sexually transmitted diseases, bone density testing, colonoscopy, mammograms, etc.   MAMMOGRAMS:  All women over 49 years old should have a yearly mammogram. Many facilities now offer a "3D" mammogram, which may cost around $50 extra out of pocket. If possible,  we recommend you accept the option to have the 3D mammogram performed.  It both reduces the number of women who will be called back for extra views which then turn out to be normal, and it is better than the routine mammogram at detecting truly abnormal areas.    COLONOSCOPY:  Colonoscopy to screen for colon cancer is recommended for all women at age 69.  We know, you hate the idea of the prep.  We agree, BUT, having colon cancer and not knowing it is worse!!  Colon cancer so often starts as a polyp that can be seen and removed at colonscopy, which can quite literally save your life!  And if your first colonoscopy is normal and you have no family history of colon cancer, most women don't have to have it again for 10 years.  Once every ten years, you can do something that may end up saving your life, right?  We will be happy to help you get it scheduled when you are ready.  Be sure to check your insurance coverage so you understand how much it will cost.  It may be covered as a preventative service at no cost, but you should check your particular policy.

## 2021-04-04 NOTE — Progress Notes (Signed)
60 y.o. G68P0002 Single Caucasian female here for annual exam. Needs refill for HRT. Reports continued bleeding since D&C on 02/22/21. Denies pain.    Pathology report showed benign endometrial polyp and inactive endometrium.   She is taking estradiol 1 mg daily and Provera 2.5 mg daily.  She would like to be off the HRT.  She does recall one skipped dosage.   She is also taking Gabapentin 300 mg q hs.  This really helps night time hot flashes a lot.   Ended last relationship.   PCP: [facility], MD or PA unknown    Patient's last menstrual period was 05/15/2016 (approximate).           Sexually active: No.  The current method of family planning is post menopausal status.    Exercising: Yes.    Walking and wt lifting at home Smoker:  no  Health Maintenance: Pap:  06/11/20- ASCUS, HPV+, 11/11/18- WNL, HPV- neg, 09/27/17- WNL, HPV- neg History of abnormal Pap:  yes, 06/11/20-ASCUS, HPV+. Colpo- ECC benign, Cx Bx- CIN 1 MMG:  02/12/20- birads 1 neg, needs to schedule Colonoscopy:  Cologuard 11/07/17- neg.  She has appointment with GI in January.  BMD:   n/a  Result  n/a TDaP:  09/27/17 Gardasil:   unsure HIV: 06/11/20 Hep C: 06/11/20 Screening Labs:  PCP Flu vaccine:  would like to do today.    reports that she has never smoked. She has never used smokeless tobacco. She reports current alcohol use. She reports that she does not use drugs.  Past Medical History:  Diagnosis Date   Depression    Endometrial polyp    Fibromyalgia    GAD (generalized anxiety disorder)    GERD (gastroesophageal reflux disease)    Herpes simplex type 2 infection 2020   History of abnormal cervical Pap smear    History of benign ovarian tumor    age 34s   History of COVID-19 05/12/2019   positive result in epic;  per pt moderate symptoms with pneumonia, no oxygen, daily home health nurse care , mostly resolved in 6 wks then residual's resolved   History of infertility, female    Hyperlipidemia     Lactose intolerance    Multiple thyroid nodules    ultraound in epic 06-03-2020 bilateral nodules did not meet critiria to bx   Ovarian hyperstimulation syndrome    PCOS (polycystic ovarian syndrome)    PMB (postmenopausal bleeding)    SUI (stress urinary incontinence, female)    Wears glasses     Past Surgical History:  Procedure Laterality Date   BREAST REDUCTION SURGERY Bilateral 2004   COLPOSCOPY     DILATATION & CURETTAGE/HYSTEROSCOPY WITH MYOSURE N/A 02/22/2021   Procedure: DILATATION & CURETTAGE/HYSTEROSCOPY WITH MYOSURE;  Surgeon: Patton Salles, MD;  Location: [facility] [place];  Service: Gynecology;  Laterality: N/A;   DILATION AND CURETTAGE OF UTERUS     teen   FERTILITY SURGERY     per pt multiple infertility surgery's via laparosocpy's/ laparotomy's,  last one approx age 57s    Current Outpatient Medications  Medication Sig Dispense Refill   ALPRAZolam (XANAX) 0.5 MG tablet Take 0.5 mg by mouth 2 (two) times daily as needed.     APPLE CIDER VINEGAR PO Take by mouth 5 (five) times daily. One gummy 5 times weekly, GOLI brand     busPIRone (BUSPAR) 15 MG tablet Take 15 mg by mouth 2 (two) times daily.     estradiol (ESTRACE)  1 MG tablet Take 1 tablet (1 mg total) by mouth daily. 30 tablet 0   gabapentin (NEURONTIN) 300 MG capsule Take 3 capsules (300 mg) nightly. (Patient taking differently: Take 300 mg by mouth at bedtime. Take 3 capsules (100 mg) nightly.) 90 capsule 1   medroxyPROGESTERone (PROVERA) 2.5 MG tablet Take 1 tablet (2.5 mg total) by mouth daily. (Patient taking differently: Take 2.5 mg by mouth at bedtime.) 30 tablet 1   Multiple Vitamin (MULTIVITAMIN) capsule Take 1 capsule by mouth daily.     valACYclovir (VALTREX) 500 MG tablet Take 1 tablet (500 mg total) by mouth daily. Take as prevention.  Take one tablet by mouth twice daily for 3 days for an outbreak. 40 tablet 0   celecoxib (CELEBREX) 200 MG capsule celecoxib 200 mg capsule  TAKE  1 CAPSULE 2 TIMES A DAY BY ORAL ROUTE AS NEEDED.     DULoxetine (CYMBALTA) 30 MG capsule duloxetine 30 mg capsule,delayed release  TAKE 1 CAPSULE BY MOUTH EVERY DAY     No current facility-administered medications for this visit.    Family History  Problem Relation Age of Onset   Depression Mother    Pancreatic cancer Mother    Diabetes Mother    Obesity Mother    Heart Problems Mother    Depression Father    Cancer Maternal Grandmother    Dementia Maternal Grandmother    Breast cancer Maternal Grandmother    ADD / ADHD Daughter     Review of Systems  Genitourinary:  Positive for vaginal bleeding.  All other systems reviewed and are negative.  Exam:   BP 122/84 (BP Location: Right Arm, Patient Position: Sitting, Cuff Size: Normal)   Pulse 65   Resp 20   Ht 5' 3.5" (1.613 m)   Wt 180 lb (81.6 kg)   LMP 05/15/2016 (Approximate)   BMI 31.39 kg/m     General appearance: alert, cooperative and appears stated age Head: normocephalic, without obvious abnormality, atraumatic Neck: no adenopathy, supple, symmetrical, trachea midline and thyroid normal to inspection and palpation Lungs: clear to auscultation bilaterally Breasts: normal appearance, consistent with reduction, no masses or tenderness, No nipple retraction or dimpling, No nipple discharge or bleeding, No axillary adenopathy Heart: regular rate and rhythm Abdomen: soft, non-tender; no masses, no organomegaly Extremities: extremities normal, atraumatic, no cyanosis or edema Skin: skin color, texture, turgor normal. No rashes or lesions Lymph nodes: cervical, supraclavicular, and axillary nodes normal. Neurologic: grossly normal  Pelvic: External genitalia:  no lesions              No abnormal inguinal nodes palpated.              Urethra:  normal appearing urethra with no masses, tenderness or lesions              Bartholins and Skenes: normal                 Vagina: normal appearing vagina with normal color and  discharge, no lesions              Cervix: no lesions.  Small amount of blood from the os.               Pap taken: Yes.   Bimanual Exam:  Uterus:  normal size, contour, position, consistency, mobility, non-tender              Adnexa: no mass, fullness, tenderness  Rectal exam: Yes.  .  Confirms.              Anus:  normal sphincter tone, no lesions  Chaperone was present for exam:  Chapman Fitch., RN  Assessment:   Well woman visit with gynecologic exam. HRT.  Postmenopausal bleeding, ongoing.  Status post recent hysteroscopic polypectomy, dilation and curettage.  ASCUS, positive HR HPV with colposcopy showing LGSIL.  Hx stress incontinence. HSV 2.  Bilateral breast reduction.   Plan: Mammogram screening discussed.  She will schedule.  Self breast awareness reviewed. Pap and HR HPV collected. Guidelines for Calcium, Vitamin D, regular exercise program including cardiovascular and weight bearing exercise. Discused WHI and use of HRT which can increase risk of PE, DVT, MI, stroke and breast cancer.  Decreased Estrace to 0.5 mg daily and continue Prometrium 100 mg q hs for 3 months and then stop all HRT.  She will continue Neurontin 300 mg po q hs.  We discussed potential sedating side effects and interaction with other medication like Xanax.  Valtrex 500 mg daily for prevention and then bid x 3 days prn outbreak.  She will call back if the bleeding persists for more 6 weeks.  STD screening.  Flu vaccine. Follow up annually and prn.   After visit summary provided.

## 2021-04-05 LAB — HEPATITIS C ANTIBODY
Hepatitis C Ab: NONREACTIVE
SIGNAL TO CUT-OFF: 0.04 (ref <1.00)

## 2021-04-05 LAB — RPR: RPR Ser Ql: NONREACTIVE

## 2021-04-05 LAB — HIV ANTIBODY (ROUTINE TESTING W REFLEX): HIV 1&2 Ab, 4th Generation: NONREACTIVE

## 2021-04-05 LAB — HEPATITIS B SURFACE ANTIGEN: Hepatitis B Surface Ag: NONREACTIVE

## 2021-04-17 LAB — CYTOLOGY - PAP
Chlamydia: NEGATIVE
Comment: NEGATIVE
Comment: NEGATIVE
Comment: NEGATIVE
Comment: NORMAL
High risk HPV: POSITIVE — AB
Neisseria Gonorrhea: NEGATIVE
Trichomonas: NEGATIVE

## 2021-04-22 ENCOUNTER — Other Ambulatory Visit: Payer: Self-pay | Admitting: *Deleted

## 2021-04-27 ENCOUNTER — Other Ambulatory Visit: Payer: Self-pay

## 2021-04-27 NOTE — Telephone Encounter (Signed)
Faxed confirmed "success."

## 2021-04-27 NOTE — Telephone Encounter (Signed)
Paper Rx refills request received re: Estradiol 1mg  tab. Per BS notes from visit on 04/04/21. Decision to decrease dose to 0.5mg  and she sent in enough Rx for 1 yr. Denied Rx fax returned to pharmacy.

## 2021-05-03 ENCOUNTER — Other Ambulatory Visit: Payer: Self-pay | Admitting: *Deleted

## 2021-05-03 DIAGNOSIS — R87612 Low grade squamous intraepithelial lesion on cytologic smear of cervix (LGSIL): Secondary | ICD-10-CM

## 2021-05-10 ENCOUNTER — Ambulatory Visit
Admission: RE | Admit: 2021-05-10 | Discharge: 2021-05-10 | Disposition: A | Discharge status: Home / Self Care | Payer: BC Managed Care – PPO | Source: Ambulatory Visit | Attending: Obstetrics and Gynecology | Admitting: Obstetrics and Gynecology

## 2021-05-10 DIAGNOSIS — Z1231 Encounter for screening mammogram for malignant neoplasm of breast: Secondary | ICD-10-CM

## 2021-05-11 ENCOUNTER — Other Ambulatory Visit: Payer: Self-pay | Admitting: Obstetrics and Gynecology

## 2021-05-11 ENCOUNTER — Telehealth: Payer: Self-pay

## 2021-05-11 NOTE — Telephone Encounter (Signed)
Patient called because she said she is out of Estradiol and almost out of Provera and thought she needed a refill.  I advised her on 04/04/21 Dr. Edward Jolly sent her Rx's in for #90 x 3 refills. I offered to call the pharmacy and ask them to ready these Rx's for her. Message left with pharmacy to fill for patient.

## 2021-05-12 ENCOUNTER — Other Ambulatory Visit: Payer: Self-pay | Admitting: Obstetrics and Gynecology

## 2021-05-12 ENCOUNTER — Other Ambulatory Visit: Payer: Self-pay

## 2021-05-12 DIAGNOSIS — R928 Other abnormal and inconclusive findings on diagnostic imaging of breast: Secondary | ICD-10-CM

## 2021-05-12 DIAGNOSIS — R87612 Low grade squamous intraepithelial lesion on cytologic smear of cervix (LGSIL): Secondary | ICD-10-CM

## 2021-06-06 ENCOUNTER — Other Ambulatory Visit: Payer: Self-pay

## 2021-06-15 NOTE — Progress Notes (Signed)
°  Subjective:     Patient ID: Anna Chavez, female   DOB: 25-Sep-1960, 61 y.o.   MRN: [id number]  HPI Colposcopy PAP: 04-04-21 LGSIL HPV HR+  Had colposcopy 2022 with ECC benign and biopsy CIN I.  On HRT.  Bled on 1/19 - 1/25.  No recent intercourse.  Has had recurrent postmenopausal bleeding.  Had hysteroscopic polypectomy 02/22/21.  She has a pedunculated fibroid near right adnexa.   She is entertaining idea of a hysterectomy.   Still has hot flashes.  She is on HRT, Gabapentin.  Her medication list states she is on Cymbalta.   We have previously talked about how this may be contributing to her hot flashes.   She had diagnostic breast imaging today, and she will need bilateral breast biopsies.   Review of Systems  Constitutional: Negative.   HENT: Negative.    Eyes: Negative.   Respiratory: Negative.    Cardiovascular: Negative.   Gastrointestinal: Negative.   Endocrine: Negative.   Genitourinary: Negative.   Musculoskeletal: Negative.   Skin:        Breast lumps on mmg, having biopsy done  Allergic/Immunologic: Negative.   Neurological: Negative.   Hematological: Negative.   Psychiatric/Behavioral: Negative.    PMP    Objective:   Physical Exam Genitourinary:    Colposcopy - cervix, vagina. Consent for procedure.  3% acetic acid used in vagina. White light and green light filter used.  Colposcopy satisfactory:  Yes   __x___          No    _____ Findings:    Cervix:  Areas of erythema and friability of the cervix at 6:00 and 10:00.  Area at 6:00 extends toward the vagina.  Vagina:  No lesions.  Biopsies:  ECC, biopsy at 6:00, biopsy at 10:00 Monsel's placed.  Minimal EBL. No complications.      Assessment:     LGSIL and positive HR HPV pap. Hx prior LGSIL. Postmenopausal bleeding on HRT.  Hx benign endometrial polyp. Recent abnormal mammograms.  Biopsies planned.    Plan:     FU biopsy results. Final plan to follow.

## 2021-06-17 ENCOUNTER — Ambulatory Visit
Admission: RE | Admit: 2021-06-17 | Discharge: 2021-06-17 | Disposition: A | Discharge status: Home / Self Care | Payer: BC Managed Care – PPO | Source: Ambulatory Visit | Attending: Obstetrics and Gynecology | Admitting: Obstetrics and Gynecology

## 2021-06-17 ENCOUNTER — Other Ambulatory Visit: Payer: Self-pay

## 2021-06-17 ENCOUNTER — Other Ambulatory Visit: Payer: Self-pay | Admitting: Obstetrics and Gynecology

## 2021-06-17 ENCOUNTER — Other Ambulatory Visit (HOSPITAL_COMMUNITY)
Admission: RE | Admit: 2021-06-17 | Discharge: 2021-06-17 | Disposition: A | Discharge status: Home / Self Care | Payer: BC Managed Care – PPO | Source: Ambulatory Visit | Attending: Obstetrics and Gynecology | Admitting: Obstetrics and Gynecology

## 2021-06-17 ENCOUNTER — Encounter: Payer: Self-pay | Admitting: Obstetrics and Gynecology

## 2021-06-17 ENCOUNTER — Ambulatory Visit: Payer: BC Managed Care – PPO | Admitting: Obstetrics and Gynecology

## 2021-06-17 VITALS — BP 120/78 | HR 101 | Resp 16 | Wt 180.0 lb

## 2021-06-17 DIAGNOSIS — R87612 Low grade squamous intraepithelial lesion on cytologic smear of cervix (LGSIL): Secondary | ICD-10-CM

## 2021-06-17 DIAGNOSIS — N6320 Unspecified lump in the left breast, unspecified quadrant: Secondary | ICD-10-CM

## 2021-06-17 DIAGNOSIS — R928 Other abnormal and inconclusive findings on diagnostic imaging of breast: Secondary | ICD-10-CM

## 2021-06-17 DIAGNOSIS — R87810 Cervical high risk human papillomavirus (HPV) DNA test positive: Secondary | ICD-10-CM | POA: Diagnosis present

## 2021-06-17 DIAGNOSIS — N6310 Unspecified lump in the right breast, unspecified quadrant: Secondary | ICD-10-CM

## 2021-06-17 NOTE — Patient Instructions (Signed)
Colposcopy, Care After ?The following information offers guidance on how to care for yourself after your procedure. Your health care provider may also give you more specific instructions. If you have problems or questions, contact your health care provider. ?What can I expect after the procedure? ?If you had a colposcopy without a biopsy, you can expect to feel fine right away after your procedure. However, you may have some spotting of blood for a few days. You can return to your normal activities. ?If you had a colposcopy with a biopsy, it is common after the procedure to have: ?Soreness and mild pain. These may last for a few days. ?Mild vaginal bleeding or discharge that is dark-colored and grainy. This may last for a few days. The discharge may be caused by a liquid (solution) that was used during the procedure. You may need to wear a sanitary pad during this time. ?Spotting of blood for at least 48 hours after the procedure. ?Follow these instructions at home: ?Medicines ?Take over-the-counter and prescription medicines only as told by your health care provider. ?Talk with your health care provider about what type of over-the-counter pain medicines and prescription medicines you can start to take again. It is especially important to talk with your health care provider if you take blood thinners. ?Activity ?Avoid using douche products, using tampons, and having sex for at least 3 days after the procedure or for as long as told by your health care provider. ?Return to your normal activities as told by your health care provider. Ask your health care provider what activities are safe for you. ?General instructions ?Ask your health care provider if you may take baths, swim, or use a hot tub. You may take showers. ?If you use birth control (contraception), continue to use it. ?Keep all follow-up visits. This is important. ?Contact a health care provider if: ?You have a fever or chills. ?You faint or feel  light-headed. ?Get help right away if: ?You have heavy bleeding from your vagina or pass blood clots. Heavy bleeding is bleeding that soaks through a sanitary pad in less than 1 hour. ?You have vaginal discharge that is abnormal, is yellow in color, or smells bad. This could be a sign of infection. ?You have severe pain or cramps in your lower abdomen that do not go away with medicine. ?Summary ?If you had a colposcopy without a biopsy, you can expect to feel fine right away, but you may have some spotting of blood for a few days. You can return to your normal activities. ?If you had a colposcopy with a biopsy, it is common to have mild pain for a few days and spotting for 48 hours after the procedure. ?Avoid using douche products, using tampons, and having sex for at least 3 days after the procedure or for as long as told by your health care provider. ?Get help right away if you have heavy bleeding, severe pain, or signs of infection. ?This information is not intended to replace advice given to you by your health care provider. Make sure you discuss any questions you have with your health care provider. ?Document Revised: 09/26/2020 Document Reviewed: 09/26/2020 ?Elsevier Patient Education ? 2022 [employer] ? ?

## 2021-06-21 LAB — SURGICAL PATHOLOGY

## 2021-06-23 ENCOUNTER — Telehealth: Payer: Self-pay

## 2021-06-23 NOTE — Telephone Encounter (Signed)
Decided to use result note to route.

## 2021-06-23 NOTE — Telephone Encounter (Deleted)
-----   Message from Patton Salles, MD sent at 06/23/2021 10:27 AM EST ----- Please contact patient with results of colposcopy showing low grade dysplasia.  This is what she has had in the past and what the recent pap showed.  There is no sign of cervical cancer.  Her next pap is due in 12 months.  Please place this recall.   With respect to the recurrent postmenopausal bleeding, this may stop if she completely stops her hormone therapy (Estrace and Provera).   Stopping her hormone treatment may also help with her breast health.  She has upcoming bilateral breast biopsies next week.   The Neurontin she takes helps to treat night time hot flashes.

## 2021-06-27 ENCOUNTER — Telehealth: Payer: Self-pay

## 2021-06-27 NOTE — Telephone Encounter (Signed)
I spoke with patient last week with a results and we had this conversation: "Patient said she still wants to have a conversation with Dr. Edward Jolly re: hysterectomy. She said Dr. Edward Jolly said to wait until Colpo biopsy results and breast biopsy results return. She anticipated breast bx result by late next week. She wants to schedule consult appointment to discuss discontinuing HRT and Hysterectomy."   I had asked her if appt desk could call her to arrange and appointment to talk with you either video or in person and she was agreeable. When [facility] called her to arrange she was very short with her and did not schedule an appt.  She is calling back in triage voice mail again today stating she wants someone to call her about d/c HRT and what next/possible hysterectomy.  She said "hopefuly Dr. Edward Jolly can call me".  Please advise if anything specific to tell her about stopping her HRT and if you want her to schedule consult to discuss hysterectomy. Thanks!

## 2021-06-27 NOTE — Telephone Encounter (Signed)
I spoke with Dr. Edward Jolly. She would like patient to come in for appointment to sit down to discuss concerns but earliest would be Friday. She asked me to schedule patient then if opening. I spoke with patient and relayed that Dr. Edward Jolly recommend just stay on HRT and they will decide about that on Friday as well. Appt scheduled for 9:30am and patient is agreeable to that.

## 2021-06-28 ENCOUNTER — Ambulatory Visit
Admission: RE | Admit: 2021-06-28 | Discharge: 2021-06-28 | Disposition: A | Discharge status: Home / Self Care | Payer: BC Managed Care – PPO | Source: Ambulatory Visit | Attending: Obstetrics and Gynecology | Admitting: Obstetrics and Gynecology

## 2021-06-28 DIAGNOSIS — R928 Other abnormal and inconclusive findings on diagnostic imaging of breast: Secondary | ICD-10-CM

## 2021-06-28 DIAGNOSIS — N6310 Unspecified lump in the right breast, unspecified quadrant: Secondary | ICD-10-CM

## 2021-06-28 DIAGNOSIS — N6320 Unspecified lump in the left breast, unspecified quadrant: Secondary | ICD-10-CM

## 2021-06-29 NOTE — Progress Notes (Signed)
GYNECOLOGY  VISIT   HPI: 61 y.o.   Single  Caucasian  female   G2P0002 with Patient's last menstrual period was 05/15/2016 (approximate).   here for  discuss breast bx results and possible hysterectomy procedure.  Benign fibroadenomas on bilateral breast biopsies on 06/28/21.  Patient has had recurrent postmenopausal bleeding on HRT.  She is status post hysteroscopy with benign polypectomy, dilation and curettage.  Is on Cymbalta.   She also had an abnormal pap LGSIL and positive HR HPV and colposcopy 06/17/21 showing benign ECC and CIN I of the cervix.   Has bleeding with intercourse in past.  Not active for one year.  Neg GC/CT/trich.  Needs to have oral surgery.   Has stress incontinence.  Leaks with sneeze and exercise.  Wearing Poise pads.  GYNECOLOGIC HISTORY: Patient's last menstrual period was 05/15/2016 (approximate). Contraception:  PMP Menopausal hormone therapy:  Yes Estradiol 1 mg Provera 2.5mg  Last mammogram:  06-17-21 mass in rt.breast  Bx 06-28-21 Fibroadenoma Last pap smear:   04-04-21 LGSIL Pos HR HPV        OB History     Gravida  2   Para  2   Term  0   Preterm  0   AB  0   Living  2      SAB  0   IAB  0   Ectopic  0   Multiple  0   Live Births  0              Patient Active Problem List   Diagnosis Date Noted   Stress incontinence 09/29/2017   Menopausal symptoms 09/29/2017    Past Medical History:  Diagnosis Date   Depression    Endometrial polyp    Fibromyalgia    GAD (generalized anxiety disorder)    GERD (gastroesophageal reflux disease)    Herpes simplex type 2 infection 2020   History of abnormal cervical Pap smear    History of benign ovarian tumor    age 74s   History of COVID-19 05/12/2019   positive result in epic;  per pt moderate symptoms with pneumonia, no oxygen, daily home health nurse care , mostly resolved in 6 wks then residual's resolved   History of infertility, female    Hyperlipidemia    Lactose  intolerance    Multiple thyroid nodules    ultraound in epic 06-03-2020 bilateral nodules did not meet critiria to bx   Ovarian hyperstimulation syndrome    PCOS (polycystic ovarian syndrome)    PMB (postmenopausal bleeding)    SUI (stress urinary incontinence, female)    Wears glasses     Past Surgical History:  Procedure Laterality Date   BREAST REDUCTION SURGERY Bilateral 2004   COLPOSCOPY     DILATATION & CURETTAGE/HYSTEROSCOPY WITH MYOSURE N/A 02/22/2021   Procedure: DILATATION & CURETTAGE/HYSTEROSCOPY WITH MYOSURE;  Surgeon: Patton Salles, MD;  Location: [facility] [place];  Service: Gynecology;  Laterality: N/A;   DILATION AND CURETTAGE OF UTERUS     teen   FERTILITY SURGERY     per pt multiple infertility surgery's via laparosocpy's/ laparotomy's,  last one approx age 89s    Current Outpatient Medications  Medication Sig Dispense Refill   ALPRAZolam (XANAX) 0.5 MG tablet Take 0.5 mg by mouth 2 (two) times daily as needed.     busPIRone (BUSPAR) 15 MG tablet Take 15 mg by mouth 2 (two) times daily.     celecoxib (CELEBREX) 200 MG capsule  celecoxib 200 mg capsule  TAKE 1 CAPSULE 2 TIMES A DAY BY ORAL ROUTE AS NEEDED.     DULoxetine (CYMBALTA) 30 MG capsule duloxetine 30 mg capsule,delayed release  TAKE 1 CAPSULE BY MOUTH EVERY DAY     estradiol (ESTRACE) 1 MG tablet Take 1 mg by mouth daily.     gabapentin (NEURONTIN) 300 MG capsule Take 1 capsule (300 mg) nightly. 90 capsule 3   medroxyPROGESTERone (PROVERA) 2.5 MG tablet Take 1 tablet (2.5 mg total) by mouth at bedtime. 90 tablet 3   Multiple Vitamin (MULTIVITAMIN) capsule Take 1 capsule by mouth daily.     valACYclovir (VALTREX) 500 MG tablet Take 1 tablet (500 mg total) by mouth daily. Take as prevention.  Take one tablet by mouth twice daily for 3 days for an outbreak. 110 tablet 3   No current facility-administered medications for this visit.     ALLERGIES: Patient has no known  allergies.  Family History  Problem Relation Age of Onset   Depression Mother    Pancreatic cancer Mother    Diabetes Mother    Obesity Mother    Heart Problems Mother    Depression Father    Cancer Maternal Grandmother    Dementia Maternal Grandmother    Breast cancer Maternal Grandmother    ADD / ADHD Daughter    Cervical cancer Other     Social History   Socioeconomic History   Marital status: Single    Spouse name: Not on file   Number of children: Not on file   Years of education: Not on file   Highest education level: Not on file  Occupational History   Not on file  Tobacco Use   Smoking status: Never   Smokeless tobacco: Never  Vaping Use   Vaping Use: Never used  Substance and Sexual Activity   Alcohol use: Yes    Comment: occasional   Drug use: Never   Sexual activity: Not Currently    Partners: Male    Birth control/protection: Post-menopausal  Other Topics Concern   Not on file  Social History Narrative   Not on file   Social Determinants of Health   Financial Resource Strain: Not on file  Food Insecurity: Not on file  Transportation Needs: Not on file  Physical Activity: Not on file  Stress: Not on file  Social Connections: Not on file  Intimate Partner Violence: Not on file    Review of Systems  See HPI.   PHYSICAL EXAMINATION:    BP 132/80    LMP 05/15/2016 (Approximate)     General appearance: alert, cooperative and appears stated age  ASSESSMENT  Cervical dyplasia, mild Positive HR HPV.  Postmenopausal bleeding.  HRT.  Stress incontinence. Recent bilateral benign breast biopsies.   PLAN  We discussed total laparoscopic hysterectomy with bilateral salpingectomy, possible bilateral oophorectomy.  Risks and benefits reviewed.  I reviewed risks, benefits, and alternatives.  Risks include but are not limited to bleeding, infection, damage to surrounding organs, pneumonia, reaction to anesthesia, DVT, PE, death, need for reoperation,  hernia formation, vaginal cuff dehiscence, neuropathy, continued pain, need to convert to a traditional laparotomy incision to complete the procedure  Written information to patient on laparoscopic hysterectomy.   Hysterectomy will not eradicate HPV and future abnormal cells appearing.  Her next cervical cancer screening will be done in one year.   After reviewing HRT in detail and risks and benefits, she will stop HRT.   Call for future vaginal bleeding.  Consider stopping Cymbalta and switching to Effexor or Paxil.   Continue Gabapentin.   We discussed midurethral sling or pelvic floor therapy for stress incontinence.  Midurethral sling can be done in conjunction with hysterectomy if this is done in the future.    An After Visit Summary was printed and given to the patient.  40 min  total time was spent for this patient encounter, including preparation, face-to-face counseling with the patient, coordination of care, and documentation of the encounter.

## 2021-07-01 ENCOUNTER — Other Ambulatory Visit: Payer: Self-pay

## 2021-07-01 ENCOUNTER — Ambulatory Visit: Payer: BC Managed Care – PPO | Admitting: Obstetrics and Gynecology

## 2021-07-01 ENCOUNTER — Encounter: Payer: Self-pay | Admitting: Obstetrics and Gynecology

## 2021-07-01 VITALS — BP 132/80

## 2021-07-01 DIAGNOSIS — N950 Postmenopausal bleeding: Secondary | ICD-10-CM

## 2021-07-01 DIAGNOSIS — Z79890 Hormone replacement therapy (postmenopausal): Secondary | ICD-10-CM | POA: Diagnosis not present

## 2021-07-01 DIAGNOSIS — R87618 Other abnormal cytological findings on specimens from cervix uteri: Secondary | ICD-10-CM

## 2021-07-01 DIAGNOSIS — N870 Mild cervical dysplasia: Secondary | ICD-10-CM

## 2021-07-01 DIAGNOSIS — N393 Stress incontinence (female) (male): Secondary | ICD-10-CM

## 2021-07-01 NOTE — Patient Instructions (Addendum)
We talked today about stopping your hormone therapy.   You may notice some increase in your hot flashes.   The gabapentin treats night time hot flashes.   Your Cymbalta may be increasing your hot flashes.  If you need treatment for stress, depression, anxiety and hot flashes,  Paxil or Effexor may be an alternative to Cymbalta.  Talk with your doctor about stopping the Cymbalta and potentially trying Paxil or Effexor.   Urinary Incontinence Urinary incontinence refers to a condition in which a person is unable to control where and when to pass urine. A person with this condition will urinate involuntarily. This means that the person urinates when he or she does not mean to. What are the causes? This condition may be caused by: Medicines. Infections. Constipation. Overactive bladder muscles. Weak bladder muscles. Weak pelvic floor muscles. These muscles provide support for the bladder, intestine, and, in women, the uterus. Enlarged prostate in men. The prostate is a gland near the bladder. When it gets too big, it can pinch the urethra. With the urethra blocked, the bladder can weaken and lose the ability to empty properly. Surgery. Emotional factors, such as anxiety, stress, or post-traumatic stress disorder (PTSD). Spinal cord injury, nerve injury, or other neurological conditions. Pelvic organ prolapse. This happens in women when organs move out of place and into the vagina. This movement can prevent the bladder and urethra from working properly. What increases the risk? The following factors may make you more likely to develop this condition: Age. The older you are, the higher the risk. Obesity. Being physically inactive. Pregnancy and childbirth. Menopause. Diseases that affect the nerves or spinal cord. Long-term, or chronic, coughing. This can increase pressure on the bladder and pelvic floor muscles. What are the signs or symptoms? Symptoms may vary depending on the type of  urinary incontinence you have. They include: A sudden urge to urinate, and passing urine involuntarily before you can get to a bathroom (urge incontinence). Suddenly passing urine when doing activities that force urine to pass, such as coughing, laughing, exercising, or sneezing (stress incontinence). Needing to urinate often but urinating only a small amount, or constantly dribbling urine (overflow incontinence). Urinating because you cannot get to the bathroom in time due to a physical disability, such as arthritis or injury, or due to a communication or thinking problem, such as Alzheimer's disease (functional incontinence). How is this diagnosed? This condition may be diagnosed based on: Your medical history. A physical exam. Tests, such as: Urine tests. X-rays of your kidney and bladder. Ultrasound. CT scan. Cystoscopy. In this procedure, a health care provider inserts a tube with a light and camera (cystoscope) through the urethra and into the bladder to check for problems. Urodynamic testing. These tests assess how well the bladder, urethra, and sphincter can store and release urine. There are different types of urodynamic tests, and they vary depending on what the test is measuring. To help diagnose your condition, your health care provider may recommend that you keep a log of when you urinate and how much you urinate. How is this treated? Treatment for this condition depends on the type of incontinence that you have and its cause. Treatment may include: Lifestyle changes, such as: Quitting smoking. Maintaining a healthy weight. Staying active. Try to get 150 minutes of moderate-intensity exercise every week. Ask your health care provider which activities are safe for you. Eating a healthy diet. Avoid high-fat foods, like fried foods. Avoid refined carbohydrates like white bread and white  rice. Limit how much alcohol and caffeine you drink. Increase your fiber intake. Healthy  sources of fiber include beans, whole grains, and fresh fruits and vegetables. Behavioral changes, such as: Pelvic floor muscle exercises. Bladder training, such as lengthening the amount of time between bathroom breaks, or using the bathroom at regular intervals. Using techniques to suppress bladder urges. This can include distraction techniques or controlled breathing exercises. Medicines, such as: Medicines to relax the bladder muscles and prevent bladder spasms. Medicines to help slow or prevent the growth of a man's prostate. Botox injections. These can help relax the bladder muscles. Treatments, such as: Using pulses of electricity to help change bladder reflexes (electrical nerve stimulation). For women, using a medical device to prevent urine leaks. This is a small, tampon-like, disposable device that is inserted into the urethra. Injecting collagen or carbon beads (bulking agents) into the urinary sphincter. These can help thicken tissue and close the bladder opening. Surgery. Follow these instructions at home: Lifestyle Limit alcohol and caffeine. These can fill your bladder quickly and irritate it. Keep yourself clean to help prevent odors and skin damage. Ask your health care provider about special skin creams and cleansers that can protect the skin from urine. Consider wearing pads or adult diapers. Make sure to change them regularly, and always change them right after experiencing incontinence. General instructions Take over-the-counter and prescription medicines only as told by your health care provider. Use the bathroom about every 3-4 hours, even if you do not feel the need to urinate. Try to empty your bladder completely every time. After urinating, wait a minute. Then try to urinate again. Make sure you are in a relaxed position while urinating. If your incontinence is caused by nerve problems, keep a log of the medicines you take and the times you go to the bathroom. Keep  all follow-up visits. This is important. Where to find more information [employer]estive [employer] Kidney Diseases: CarFli[employer]tn American Urology Association: www.urologyhealth.org Contact a health care provider if: You have pain that gets worse. Your incontinence gets worse. Get help right away if: You have a fever or chills. You are unable to urinate. You have redness in your groin area or down your legs. Summary Urinary incontinence refers to a condition in which a person is unable to control where and when to pass urine. This condition may be caused by medicines, infection, weak bladder muscles, weak pelvic floor muscles, enlargement of the prostate (in men), or surgery. Factors such as older age, obesity, pregnancy and childbirth, menopause, neurological diseases, and chronic coughing may increase your risk for developing this condition. Types of urinary incontinence include urge incontinence, stress incontinence, overflow incontinence, and functional incontinence. This condition is usually treated first with lifestyle and behavioral changes, such as quitting smoking, eating a healthier diet, and doing regular pelvic floor exercises. Other treatment options include medicines, bulking agents, medical devices, electrical nerve stimulation, or surgery. This information is not intended to replace advice given to you by your health care provider. Make sure you discuss any questions you have with your health care provider.  Total Laparoscopic Hysterectomy A total laparoscopic hysterectomy is a minimally invasive surgery to remove the uterus and cervix. The fallopian tubes and ovaries can also be removed during this surgery, if necessary. This procedure may be done to treat problems such as: Growths in the uterus (uterine fibroids) that are not cancer but cause symptoms. A condition that causes the lining of the uterus to grow  in other areas (endometriosis). Problems  with pelvic support. Cancer of the cervix, ovaries, uterus, or tissue that lines the uterus (endometrium). Excessive bleeding in the uterus. After this procedure, you will no longer be able to have a baby, and you will no longer have a menstrual period. Tell a health care provider about: Any allergies you have. All medicines you are taking, including vitamins, herbs, eye drops, creams, and over-the-counter medicines. Any problems you or family members have had with anesthetic medicines. Any blood disorders you have. Any surgeries you have had. Any medical conditions you have. Whether you are pregnant or may be pregnant. What are the risks? Generally, this is a safe procedure. However, problems may occur, including: Infection. Bleeding. Blood clots in the legs or lungs. Allergic reactions to medicines. Damage to nearby structures or organs. Having to change from this surgery to one in which a large incision is made in the abdomen (abdominal hysterectomy). What happens before the procedure? Staying hydrated Follow instructions from your health care provider about hydration, which may include: Up to 2 hours before the procedure - you may continue to drink clear liquids, such as water, clear fruit juice, black coffee, and plain tea.  Eating and drinking restrictions Follow instructions from your health care provider about eating and drinking, which may include: 8 hours before the procedure - stop eating heavy meals or foods, such as meat, fried foods, or fatty foods. 6 hours before the procedure - stop eating light meals or foods, such as toast or cereal. 6 hours before the procedure - stop drinking milk or drinks that contain milk. 2 hours before the procedure - stop drinking clear liquids. Medicines Ask your health care provider about: Changing or stopping your regular medicines. This is especially important if you are taking diabetes medicines or blood thinners. Taking medicines such  as aspirin and ibuprofen. These medicines can thin your blood. Do not take these medicines unless your health care provider tells you to take them. Taking over-the-counter medicines, vitamins, herbs, and supplements. You may be asked to take medicine that helps you have a bowel movement (laxative) to prevent constipation. General instructions If you were asked to do bowel preparation before the procedure, follow instructions from your health care provider. This procedure can affect the way you feel about yourself. Talk with your health care provider about the physical and emotional changes hysterectomy may cause. Do not use any products that contain nicotine or tobacco for at least 4 weeks before the procedure. These products include cigarettes, chewing tobacco, and vaping devices, such as e-cigarettes. If you need help quitting, ask your health care provider. Plan to have a responsible adult take you home from the hospital or clinic. Plan to have a responsible adult care for you for the time you are told after you leave the hospital or clinic. This is important. Surgery safety Ask your health care provider: How your surgery site will be marked. What steps will be taken to help prevent infection. These may include: Removing hair at the surgery site. Washing skin with a germ-killing soap. Receiving antibiotic medicine. What happens during the procedure? An IV will be inserted into one of your veins. You will be given one or more of the following: A medicine to help you relax (sedative). A medicine to make you fall asleep (general anesthetic). A medicine to numb the area (local anesthetic). A medicine that is injected into your spine to numb the area below and slightly above the injection site (spinal  anesthetic). A medicine that is injected into an area of your body to numb everything below the injection site (regional anesthetic). A gas will be used to inflate your abdomen. This will allow  your surgeon to look inside your abdomen and do the surgery. Three or four small incisions will be made in your abdomen. A small device with a light (laparoscope) will be inserted into one of your incisions. Surgical instruments will be inserted through the other incisions in order to perform the procedure. Your uterus and cervix may be removed through your vagina or cut into small pieces and removed through the small incisions. Any other organs that need to be removed will also be removed this way. The gas will be released from inside your abdomen. Your incisions will be closed with stitches (sutures), skin glue, or adhesive strips. A bandage (dressing) may be placed over your incisions. The procedure may vary among health care providers and hospitals. What happens after the procedure? Your blood pressure, heart rate, breathing rate, and blood oxygen level will be monitored until you leave the hospital or clinic. You will be given medicine for pain as needed. You will be encouraged to walk as soon as possible. You will also use a device to help you breathe or do breathing exercises to keep your lungs clear. You may have to wear compression stockings. These stockings help to prevent blood clots and reduce swelling in your legs. You will need to wear a sanitary pad for vaginal discharge or bleeding. Summary Total laparoscopic hysterectomy is a procedure to remove your uterus, cervix, and sometimes the fallopian tubes and ovaries. This procedure can affect the way you feel about yourself. Talk with your health care provider about the physical and emotional changes hysterectomy may cause. After this procedure, you will no longer be able to have a baby, and you will no longer have a menstrual period. You will be given pain medicine to control discomfort after this procedure. Plan to have a responsible adult take you home from the hospital or clinic. This information is not intended to replace advice  given to you by your health care provider. Make sure you discuss any questions you have with your health care provider. Document Revised: 01/02/2020 Document Reviewed: 01/02/2020 Elsevier Patient Education  2022 [employer]vier Inc.  Document Revised: 12/05/2019 Document Reviewed: 12/05/2019 Elsevier Patient Education  2022 [employer].

## 2021-07-06 ENCOUNTER — Other Ambulatory Visit: Payer: Self-pay

## 2021-07-06 DIAGNOSIS — Z79890 Hormone replacement therapy (postmenopausal): Secondary | ICD-10-CM

## 2021-07-06 NOTE — Telephone Encounter (Signed)
90 day Refill request from pharmacy.

## 2021-07-08 ENCOUNTER — Other Ambulatory Visit: Payer: Self-pay

## 2021-07-08 DIAGNOSIS — Z79890 Hormone replacement therapy (postmenopausal): Secondary | ICD-10-CM

## 2022-02-16 ENCOUNTER — Telehealth: Payer: Self-pay | Admitting: *Deleted

## 2022-02-16 NOTE — Telephone Encounter (Signed)
Patient called currently in [place] with her daughter who is currently in hospital for 3 weeks now. Patient called stating she had Cervical Mri due to worsen neck pain that was ordered by her daughter provider in [place] (she mentioned the neck pain to him and explained her last imaging was done in 2021). Patient received results on 02/14/22 which said " large complex in right lobe of thyroid measuring 3.4 cm, thyroid ultrasound". She is not able to upload the report to my chart.  She is asking what to do? I asked about her PCP provider and patient reports she has not seen her in years she concerned about who can help her with this thyroid ultrasound. Reports she is not a patient of her daughter physician. She would like you advice.  Patient is not sure when she will return to [place] due to daughter admission.  Please advise

## 2022-02-18 NOTE — Telephone Encounter (Signed)
The provider who ordered the test will need to advise Anna Chavez on next steps in her care.   If she is having ongoing pain and does not know how long she will be in [place], she may wish to do a consultation there locally.

## 2022-02-20 NOTE — Telephone Encounter (Signed)
Patient informed. 

## 2022-02-28 ENCOUNTER — Other Ambulatory Visit: Payer: Self-pay | Admitting: Physician Assistant

## 2022-02-28 DIAGNOSIS — E041 Nontoxic single thyroid nodule: Secondary | ICD-10-CM

## 2022-03-02 ENCOUNTER — Ambulatory Visit
Admission: RE | Admit: 2022-03-02 | Discharge: 2022-03-02 | Disposition: A | Discharge status: Home / Self Care | Payer: BC Managed Care – PPO | Source: Ambulatory Visit | Attending: Physician Assistant | Admitting: Physician Assistant

## 2022-03-02 DIAGNOSIS — E041 Nontoxic single thyroid nodule: Secondary | ICD-10-CM

## 2022-03-14 ENCOUNTER — Other Ambulatory Visit: Payer: Self-pay | Admitting: Physician Assistant

## 2022-03-14 DIAGNOSIS — E041 Nontoxic single thyroid nodule: Secondary | ICD-10-CM

## 2022-03-27 ENCOUNTER — Other Ambulatory Visit (HOSPITAL_COMMUNITY)
Admission: RE | Admit: 2022-03-27 | Discharge: 2022-03-27 | Disposition: A | Discharge status: Home / Self Care | Payer: BC Managed Care – PPO | Source: Ambulatory Visit | Attending: Physician Assistant | Admitting: Physician Assistant

## 2022-03-27 ENCOUNTER — Ambulatory Visit
Admission: RE | Admit: 2022-03-27 | Discharge: 2022-03-27 | Disposition: A | Discharge status: Home / Self Care | Payer: BC Managed Care – PPO | Source: Ambulatory Visit | Attending: Physician Assistant | Admitting: Physician Assistant

## 2022-03-27 DIAGNOSIS — E041 Nontoxic single thyroid nodule: Secondary | ICD-10-CM | POA: Insufficient documentation

## 2022-03-27 NOTE — Procedures (Signed)
Interventional Radiology Procedure Note  Procedure:  US guided FNA biopsy of right thyroid nodule. With [facility] .  Complications: None  Recommendations:  - Routine wound care - Follow up path    Signed,  Yvone Neu. Loreta Ave, DO

## 2022-03-29 LAB — CYTOLOGY - NON PAP

## 2022-06-01 HISTORY — PX: OTHER SURGICAL HISTORY: SHX169

## 2022-07-05 ENCOUNTER — Other Ambulatory Visit: Payer: Self-pay | Admitting: Obstetrics and Gynecology

## 2022-07-05 DIAGNOSIS — Z1231 Encounter for screening mammogram for malignant neoplasm of breast: Secondary | ICD-10-CM

## 2022-07-06 ENCOUNTER — Ambulatory Visit
Admission: RE | Admit: 2022-07-06 | Discharge: 2022-07-06 | Disposition: A | Discharge status: Home / Self Care | Payer: BC Managed Care – PPO | Source: Ambulatory Visit | Attending: Obstetrics and Gynecology | Admitting: Obstetrics and Gynecology

## 2022-07-06 DIAGNOSIS — Z1231 Encounter for screening mammogram for malignant neoplasm of breast: Secondary | ICD-10-CM

## 2022-11-30 NOTE — Progress Notes (Unsigned)
GYNECOLOGY  VISIT   HPI: 62 y.o.   Single  Caucasian  female   G2P0002 with Patient's last menstrual period was 05/15/2016 (approximate).   here for   bleeding w/ intercourse. Pt reports this has been occurring for a few months, with some sharp pains. 10 days ago, the bleeding was more intense and less spotting and a lot more of blood on the sheets.  Digital penetration causes pain.    Bleeding is only with intercourse and not otherwise.   New partner.  No current condom use.   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   Taking Valtrex nightly.   Off HRT.   GYNECOLOGIC HISTORY: Patient's last menstrual period was 05/15/2016 (approximate). Contraception:  PMP Menopausal hormone therapy:  none. Last mammogram:  07/06/22 Breast Density Cat B, BI-RADS CAT 1 neg Last pap smear:   04/04/21 LSIL: HR HPV positive, 06/11/20 ASCUS: HR HPV positive        OB History     Gravida  2   Para  2   Term  0   Preterm  0   AB  0   Living  2      SAB  0   IAB  0   Ectopic  0   Multiple  0   Live Births  0              Patient Active Problem List   Diagnosis Date Noted   Stress incontinence 09/29/2017   Menopausal symptoms 09/29/2017    Past Medical History:  Diagnosis Date   Depression    Endometrial polyp    Fibromyalgia    GAD (generalized anxiety disorder)    GERD (gastroesophageal reflux disease)    Herpes simplex type 2 infection 2020   History of abnormal cervical Pap smear    History of benign ovarian tumor    age 45s   History of COVID-19 05/12/2019   positive result in epic;  per pt moderate symptoms with pneumonia, no oxygen, daily home health nurse care , mostly resolved in 6 wks then residual's resolved   History of infertility, female    Hyperlipidemia    Lactose intolerance    Multiple thyroid nodules    ultraound in epic 06-03-2020 bilateral nodules did not meet critiria to bx   Ovarian hyperstimulation syndrome    PCOS (polycystic  ovarian syndrome)    PMB (postmenopausal bleeding)    SUI (stress urinary incontinence, female)    Wears glasses     Past Surgical History:  Procedure Laterality Date   ACDF   06/01/2022   BREAST REDUCTION SURGERY Bilateral 2004   COLPOSCOPY     DILATATION & CURETTAGE/HYSTEROSCOPY WITH MYOSURE N/A 02/22/2021   Procedure: DILATATION & CURETTAGE/HYSTEROSCOPY WITH MYOSURE;  Surgeon: Patton Salles, MD;  Location: [facility] [place];  Service: Gynecology;  Laterality: N/A;   DILATION AND CURETTAGE OF UTERUS     teen   FERTILITY SURGERY     per pt multiple infertility surgery's via laparosocpy's/ laparotomy's,  last one approx age 45s    Current Outpatient Medications  Medication Sig Dispense Refill   busPIRone (BUSPAR) 15 MG tablet Take 15 mg by mouth 2 (two) times daily.     gabapentin (NEURONTIN) 300 MG capsule Take 1 capsule (300 mg) nightly. 90 capsule 3   methocarbamol (ROBAXIN) 500 MG tablet Take 500 mg by mouth 3 (three) times daily as needed.     Multiple Vitamin (MULTIVITAMIN) capsule  Take 1 capsule by mouth daily.     valACYclovir (VALTREX) 500 MG tablet Take 1 tablet (500 mg total) by mouth daily. Take as prevention.  Take one tablet by mouth twice daily for 3 days for an outbreak. 110 tablet 3   ALPRAZolam (XANAX) 0.5 MG tablet Take 0.5 mg by mouth 2 (two) times daily as needed. (Patient not taking: Reported on 12/06/2022)     No current facility-administered medications for this visit.     ALLERGIES: Patient has no known allergies.  Family History  Problem Relation Age of Onset   Depression Mother    Pancreatic cancer Mother    Diabetes Mother    Obesity Mother    Heart Problems Mother    Depression Father    Cancer Maternal Grandmother    Dementia Maternal Grandmother    Breast cancer Maternal Grandmother    ADD / ADHD Daughter    Cervical cancer Other     Social History   Socioeconomic History   Marital status: Single    Spouse name:  Not on file   Number of children: Not on file   Years of education: Not on file   Highest education level: Not on file  Occupational History   Not on file  Tobacco Use   Smoking status: Never   Smokeless tobacco: Never  Vaping Use   Vaping status: Never Used  Substance and Sexual Activity   Alcohol use: Yes    Comment: occasional   Drug use: Never   Sexual activity: Yes    Partners: Male    Birth control/protection: Post-menopausal  Other Topics Concern   Not on file  Social History Narrative   Not on file   Social Determinants of Health   Financial Resource Strain: Not on file  Food Insecurity: Not on file  Transportation Needs: Not on file  Physical Activity: Not on file  Stress: Not on file  Social Connections: Not on file  Intimate Partner Violence: Not on file    Review of Systems  All other systems reviewed and are negative.   PHYSICAL EXAMINATION:    BP 122/84 (BP Location: Right Arm, Patient Position: Sitting, Cuff Size: Normal)   Pulse 75   Ht 5\' 4"  (1.626 m)   Wt 179 lb (81.2 kg)   LMP 05/15/2016 (Approximate)   SpO2 100%   BMI 30.73 kg/m     General appearance: alert, cooperative and appears stated age   Pelvic: External genitalia:  no lesions              Urethra:  normal appearing urethra with no masses, tenderness or lesions              Bartholins and Skenes: normal                 Vagina: normal appearing vagina with normal color and discharge, no lesions              Cervix: no lesions                Bimanual Exam:  Uterus:  normal size, contour, position, consistency, mobility, non-tender              Adnexa: no mass, fullness, tenderness       Chaperone was present for exam:  Warren Lacy, CMA  ASSESSMENT  Postcoital bleeding. May be due to atrophy.  Hx CIN I.  Cervical cancer screening.  STD screening.  PLAN  GC/CT/trich testing.  Pap  and HR HPV collected.  Declines serum STD screening.  Rx for Vagifem.  Instructed in use.  I  discussed potential effect on breast cancer.  Annual exam scheduled for November, 2023.  If bleeding continues, will order pelvic ultrasound.    21 min  total time was spent for this patient encounter, including preparation, face-to-face counseling with the patient, coordination of care, and documentation of the encounter.

## 2022-12-06 ENCOUNTER — Encounter: Payer: Self-pay | Admitting: Obstetrics and Gynecology

## 2022-12-06 ENCOUNTER — Other Ambulatory Visit (HOSPITAL_COMMUNITY)
Admission: RE | Admit: 2022-12-06 | Discharge: 2022-12-06 | Disposition: A | Discharge status: Home / Self Care | Payer: BC Managed Care – PPO | Source: Ambulatory Visit | Attending: Obstetrics and Gynecology | Admitting: Obstetrics and Gynecology

## 2022-12-06 ENCOUNTER — Ambulatory Visit (INDEPENDENT_AMBULATORY_CARE_PROVIDER_SITE_OTHER): Payer: BC Managed Care – PPO | Admitting: Obstetrics and Gynecology

## 2022-12-06 VITALS — BP 122/84 | HR 75 | Ht 64.0 in | Wt 179.0 lb

## 2022-12-06 DIAGNOSIS — Z124 Encounter for screening for malignant neoplasm of cervix: Secondary | ICD-10-CM | POA: Diagnosis not present

## 2022-12-06 DIAGNOSIS — N930 Postcoital and contact bleeding: Secondary | ICD-10-CM | POA: Diagnosis not present

## 2022-12-06 DIAGNOSIS — N870 Mild cervical dysplasia: Secondary | ICD-10-CM

## 2022-12-06 DIAGNOSIS — Z113 Encounter for screening for infections with a predominantly sexual mode of transmission: Secondary | ICD-10-CM | POA: Insufficient documentation

## 2022-12-06 MED ORDER — ESTRADIOL 10 MCG VA TABS: 1.0000 | ORAL_TABLET | VAGINAL | 1 refills | Status: DC

## 2022-12-06 NOTE — Patient Instructions (Signed)
Estradiol Vaginal insert What is this medication? ESTRADIOL (es tra DYE ole) relieves the symptoms of menopause, such as vaginal irritation, dryness, or pain during sex. It works by increasing levels of the hormone estrogen in the body. It is an estrogen hormone. This medicine may be used for other purposes; ask your health care provider or pharmacist if you have questions. COMMON BRAND NAME(S): Imvexxy, Vagifem, Yuvafem What should I tell my care team before I take this medication? They need to know if you have any of these conditions: Asthma Blood clotting disorder or history of blood clots Cancer, such as breast, cervical, or liver cancer Diabetes Gallbladder disease Having surgery Heart or blood vessel conditions Hereditary angioedema, a genetic condition that causes episodes of severe swelling High blood pressure High cholesterol High levels of calcium in your blood History of heart attack History of stroke Hysterectomy Kidney disease Liver disease Lupus Migraine or other severe headaches Porphyria Seizures Thyroid disease Tobacco use Unusual vaginal bleeding An unusual or allergic reaction to estrogens, other medications, foods, dyes, or preservatives Pregnant or trying to get pregnant Breastfeeding How should I use this medication? This medication is for vaginal use only. Do not take by mouth. Follow the directions that are included with your prescription. Wash hands before and after use. Keep using this medication unless your care team tells you to stop. This medication comes with INSTRUCTIONS FOR USE. Ask your pharmacist for directions on how to use this medication. Read the information carefully. Talk to your pharmacist or care team if you have questions. A patient package insert for the product will be given with each prescription and refill. Read this sheet carefully each time. The sheet may change frequently. Contact your care team about the use of this medication in  children. Special care may be needed. Overdosage: If you think you have taken too much of this medicine contact a poison control center or emergency room at once. NOTE: This medicine is only for you. Do not share this medicine with others. What if I miss a dose? If you miss a dose, use it as soon as you can. If it is almost time for your next dose, use only that dose. Do not use double or extra doses. What may interact with this medication? This medication may affect how other medications work, and other medications may affect the way this medication works. Talk with your care team about all of the medications you take. They may suggest changes to your treatment plan to lower the risk of side effects and to make sure your medications work as intended. This list may not describe all possible interactions. Give your health care provider a list of all the medicines, herbs, non-prescription drugs, or dietary supplements you use. Also tell them if you smoke, drink alcohol, or use illegal drugs. Some items may interact with your medicine. What should I watch for while using this medication? Visit your care team for regular checks on your progress. Tell your care team if your symptoms do not start to get better or if they get worse. Talk to your care team about how often you should have a pelvic exam, breast exam, and a mammogram. Talk to your care team about your risk of cancer. You may be more at risk for certain types of cancer if you take this medication. Talk to your care team right away if you have vaginal bleeding while on this medication. If you have a uterus, talk to your care team about whether  adding a progestin to your hormone therapy is right for you. Taking progestins with estrogen therapy may lower the risk of uterine cancer, but can have other health risks. Talk to your care team if you use tobacco products. Changes to your treatment plan may be needed. Tobacco increases the risk of getting a  blood clot or having a stroke while you are taking this medication. This risk is higher if you are 35 years or older. Tell your care team right away if you have any change in your eyesight. If you are going to need surgery or other procedure, tell your care team that you are using this medication. If you may be pregnant, stop taking this medication right away and contact your care team. What side effects may I notice from receiving this medication? Side effects that you should report to your care team as soon as possible: Allergic reactions or angioedema--skin rash, itching or hives, swelling of the face, eyes, lips, tongue, arms, or legs, trouble swallowing or breathing Blood clot--pain, swelling, or warmth in the leg, shortness of breath, chest pain Breast tissue changes, new lumps, redness, pain, or discharge from the nipple Change in vision Gallbladder problems--severe stomach pain, nausea, vomiting, fever Increase in blood pressure Liver injury--right upper belly pain, loss of appetite, nausea, light-colored stool, dark yellow or brown urine, yellowing skin or eyes, unusual weakness or fatigue Stroke--sudden numbness or weakness of the face, arm, or leg, trouble speaking, confusion, trouble walking, loss of balance or coordination, dizziness, severe headache, change in vision Unusual vaginal discharge, itching, or odor Vaginal bleeding after menopause, pelvic pain Side effects that usually do not require medical attention (report these to your care team if they continue or are bothersome): Bloating Breast pain or tenderness Hair loss Nausea Stomach pain Swelling of the ankles, hands, or feet Vaginal irritation at application site This list may not describe all possible side effects. Call your doctor for medical advice about side effects. You may report side effects to FDA at 1-800-FDA-1088. Where should I keep my medication? Keep out of the reach of children and pets. Store at room  temperature between 20 and 25 degrees C (68 and 77 degrees F). Get rid of any unused medication after the expiration date. To get rid of medications that are no longer needed or have expired: Take the medication to a medication take-back program. Check with your pharmacy or law enforcement to find a location. If you cannot return the medication, ask your pharmacist or care team how to get rid of this medication safely. NOTE: This sheet is a summary. It may not cover all possible information. If you have questions about this medicine, talk to your doctor, pharmacist, or health care provider.  2024 Elsevier/Gold Standard (2022-05-23 00:00:00)

## 2022-12-07 LAB — CERVICOVAGINAL ANCILLARY ONLY
Chlamydia: NEGATIVE
Comment: NEGATIVE
Comment: NEGATIVE
Comment: NORMAL
Neisseria Gonorrhea: NEGATIVE
Trichomonas: NEGATIVE

## 2022-12-18 ENCOUNTER — Other Ambulatory Visit: Payer: Self-pay

## 2022-12-18 DIAGNOSIS — R87612 Low grade squamous intraepithelial lesion on cytologic smear of cervix (LGSIL): Secondary | ICD-10-CM

## 2022-12-26 ENCOUNTER — Other Ambulatory Visit: Payer: Self-pay

## 2022-12-26 DIAGNOSIS — B009 Herpesviral infection, unspecified: Secondary | ICD-10-CM

## 2022-12-26 MED ORDER — VALACYCLOVIR HCL 500 MG PO TABS: 500.0000 mg | ORAL_TABLET | Freq: Every day | ORAL | 0 refills | Status: DC

## 2022-12-26 NOTE — Telephone Encounter (Signed)
Med refill request: Last AEX: 04/04/2021 Next AEX: 03/26/2023 Last MMG (if hormonal med) n/a Refill authorized: rx pend.

## 2022-12-29 NOTE — Telephone Encounter (Signed)
Pt LVM in triage line stating she did not receive a cb about her med refill request.   Spoke w/ pharmacy and confirmed that they received rx and have gotten it ready for pt to pick up.   Pt notified and voiced understanding/appreciation for call/help.

## 2023-01-24 NOTE — Progress Notes (Deleted)
GYNECOLOGY  VISIT   HPI: 62 y.o.   Single  Caucasian  female   G2P0002 with Patient's last menstrual period was 05/15/2016 (approximate).   here for   colpo  GYNECOLOGIC HISTORY: Patient's last menstrual period was 05/15/2016 (approximate). Contraception:  PMP Menopausal hormone therapy:  09/27/17 Last mammogram:  07/06/22 Breast density Cat B, BI-RADS CAT 1 neg Last pap smear:   12/06/22 LSIL: HR HPV positive, 04/04/21 LSIL: HR HPV positive        OB History     Gravida  2   Para  2   Term  0   Preterm  0   AB  0   Living  2      SAB  0   IAB  0   Ectopic  0   Multiple  0   Live Births  0              Patient Active Problem List   Diagnosis Date Noted   Stress incontinence 09/29/2017   Menopausal symptoms 09/29/2017    Past Medical History:  Diagnosis Date   Depression    Endometrial polyp    Fibromyalgia    GAD (generalized anxiety disorder)    GERD (gastroesophageal reflux disease)    Herpes simplex type 2 infection 2020   History of abnormal cervical Pap smear    History of benign ovarian tumor    age 34s   History of COVID-19 05/12/2019   positive result in epic;  per pt moderate symptoms with pneumonia, no oxygen, daily home health nurse care , mostly resolved in 6 wks then residual's resolved   History of infertility, female    Hyperlipidemia    Lactose intolerance    Multiple thyroid nodules    ultraound in epic 06-03-2020 bilateral nodules did not meet critiria to bx   Ovarian hyperstimulation syndrome    PCOS (polycystic ovarian syndrome)    PMB (postmenopausal bleeding)    SUI (stress urinary incontinence, female)    Wears glasses     Past Surgical History:  Procedure Laterality Date   ACDF   06/01/2022   BREAST REDUCTION SURGERY Bilateral 2004   COLPOSCOPY     DILATATION & CURETTAGE/HYSTEROSCOPY WITH MYOSURE N/A 02/22/2021   Procedure: DILATATION & CURETTAGE/HYSTEROSCOPY WITH MYOSURE;  Surgeon: Patton Salles, MD;   Location: [facility];  Service: Gynecology;  Laterality: N/A;   DILATION AND CURETTAGE OF UTERUS     teen   FERTILITY SURGERY     per pt multiple infertility surgery's via laparosocpy's/ laparotomy's,  last one approx age 40s    Current Outpatient Medications  Medication Sig Dispense Refill   ALPRAZolam (XANAX) 0.5 MG tablet Take 0.5 mg by mouth 2 (two) times daily as needed. (Patient not taking: Reported on 12/06/2022)     busPIRone (BUSPAR) 15 MG tablet Take 15 mg by mouth 2 (two) times daily.     Estradiol (VAGIFEM) 10 MCG TABS vaginal tablet Place 1 tablet (10 mcg total) vaginally 2 (two) times a week. Use every night before bed for two weeks when you first begin this medicine, then after the first two weeks, begin using it twice a week. 34 tablet 1   gabapentin (NEURONTIN) 300 MG capsule Take 1 capsule (300 mg) nightly. 90 capsule 3   methocarbamol (ROBAXIN) 500 MG tablet Take 500 mg by mouth 3 (three) times daily as needed.     Multiple Vitamin (MULTIVITAMIN) capsule Take 1 capsule by mouth  daily.     valACYclovir (VALTREX) 500 MG tablet Take 1 tablet (500 mg total) by mouth daily. Take as prevention.  Take one tablet by mouth twice daily for 3 days for an outbreak. 110 tablet 0   No current facility-administered medications for this visit.     ALLERGIES: Patient has no known allergies.  Family History  Problem Relation Age of Onset   Depression Mother    Pancreatic cancer Mother    Diabetes Mother    Obesity Mother    Heart Problems Mother    Depression Father    Cancer Maternal Grandmother    Dementia Maternal Grandmother    Breast cancer Maternal Grandmother    ADD / ADHD Daughter    Cervical cancer Other     Social History   Socioeconomic History   Marital status: Single    Spouse name: Not on file   Number of children: Not on file   Years of education: Not on file   Highest education level: Not on file  Occupational History   Not on file   Tobacco Use   Smoking status: Never   Smokeless tobacco: Never  Vaping Use   Vaping status: Never Used  Substance and Sexual Activity   Alcohol use: Yes    Comment: occasional   Drug use: Never   Sexual activity: Yes    Partners: Male    Birth control/protection: Post-menopausal  Other Topics Concern   Not on file  Social History Narrative   Not on file   Social Determinants of Health   Financial Resource Strain: Not on file  Food Insecurity: Not on file  Transportation Needs: Not on file  Physical Activity: Not on file  Stress: Not on file  Social Connections: Not on file  Intimate Partner Violence: Not on file    Review of Systems  PHYSICAL EXAMINATION:    LMP 05/15/2016 (Approximate)     General appearance: alert, cooperative and appears stated age Head: Normocephalic, without obvious abnormality, atraumatic Neck: no adenopathy, supple, symmetrical, trachea midline and thyroid normal to inspection and palpation Lungs: clear to auscultation bilaterally Breasts: normal appearance, no masses or tenderness, No nipple retraction or dimpling, No nipple discharge or bleeding, No axillary or supraclavicular adenopathy Heart: regular rate and rhythm Abdomen: soft, non-tender, no masses,  no organomegaly Extremities: extremities normal, atraumatic, no cyanosis or edema Skin: Skin color, texture, turgor normal. No rashes or lesions Lymph nodes: Cervical, supraclavicular, and axillary nodes normal. No abnormal inguinal nodes palpated Neurologic: Grossly normal  Pelvic: External genitalia:  no lesions              Urethra:  normal appearing urethra with no masses, tenderness or lesions              Bartholins and Skenes: normal                 Vagina: normal appearing vagina with normal color and discharge, no lesions              Cervix: no lesions                Bimanual Exam:  Uterus:  normal size, contour, position, consistency, mobility, non-tender               Adnexa: no mass, fullness, tenderness              Rectal exam: {yes no:[id number]}.  Confirms.  Anus:  normal sphincter tone, no lesions  Chaperone was present for exam:  ***  ASSESSMENT     PLAN     An After Visit Summary was printed and given to the patient.  ______ minutes face to face time of which over 50% was spent in counseling.

## 2023-02-07 ENCOUNTER — Encounter: Payer: BC Managed Care – PPO | Admitting: Obstetrics and Gynecology

## 2023-02-19 NOTE — Progress Notes (Unsigned)
GYNECOLOGY  VISIT   HPI: 62 y.o.   Single  Caucasian female   G2P0002 with Patient's last menstrual period was 05/15/2016 (approximate).   here for: colpo. Pt does want to discuss hysterectomy as well as a  referral to urogyn for bladder prolapse.  Leaking urine with sneezing, coughing.  Noticing vaginal prolapse also.   Long hx of abnormal paps.  She has had 2 other colposcopies showing LGSIL in 2023 and 2022.   No further vaginal bleeding, which was occurring with intercourse only.  Using vaginal estradiol twice weekly.   GYNECOLOGIC HISTORY: Patient's last menstrual period was 05/15/2016 (approximate). Contraception:  PMP Menopausal hormone therapy:  estradiol Last mammogram:  07/06/22 Breast Density Cat B, BI-RADS CAT 1 neg Last pap smear:   12/06/22 LSIL: HR HPV positive, 04/04/21 LSIL: HR HPV positive        OB History     Gravida  2   Para  2   Term  0   Preterm  0   AB  0   Living  2      SAB  0   IAB  0   Ectopic  0   Multiple  0   Live Births  0              Patient Active Problem List   Diagnosis Date Noted   Stress incontinence 09/29/2017   Menopausal symptoms 09/29/2017    Past Medical History:  Diagnosis Date   Depression    Endometrial polyp    Fibromyalgia    GAD (generalized anxiety disorder)    GERD (gastroesophageal reflux disease)    Herpes simplex type 2 infection 2020   History of abnormal cervical Pap smear    History of benign ovarian tumor    age 15s   History of COVID-19 05/12/2019   positive result in epic;  per pt moderate symptoms with pneumonia, no oxygen, daily home health nurse care , mostly resolved in 6 wks then residual's resolved   History of infertility, female    Hyperlipidemia    Lactose intolerance    Multiple thyroid nodules    ultraound in epic 06-03-2020 bilateral nodules did not meet critiria to bx   Ovarian hyperstimulation syndrome    PCOS (polycystic ovarian syndrome)    PMB (postmenopausal  bleeding)    SUI (stress urinary incontinence, female)    Wears glasses     Past Surgical History:  Procedure Laterality Date   ACDF   06/01/2022   BREAST REDUCTION SURGERY Bilateral 2004   COLPOSCOPY     DILATATION & CURETTAGE/HYSTEROSCOPY WITH MYOSURE N/A 02/22/2021   Procedure: DILATATION & CURETTAGE/HYSTEROSCOPY WITH MYOSURE;  Surgeon: Patton Salles, MD;  Location: [facility];  Service: Gynecology;  Laterality: N/A;   DILATION AND CURETTAGE OF UTERUS     teen   FERTILITY SURGERY     per pt multiple infertility surgery's via laparosocpy's/ laparotomy's,  last one approx age 71s    Current Outpatient Medications  Medication Sig Dispense Refill   busPIRone (BUSPAR) 15 MG tablet Take 15 mg by mouth 2 (two) times daily.     DULoxetine (CYMBALTA) 20 MG capsule Take 40 mg by mouth daily.     gabapentin (NEURONTIN) 300 MG capsule Take 1 capsule (300 mg) nightly. 90 capsule 3   methocarbamol (ROBAXIN) 500 MG tablet Take 500 mg by mouth 3 (three) times daily as needed.     Multiple Vitamin (MULTIVITAMIN) capsule Take  1 capsule by mouth daily.     valACYclovir (VALTREX) 500 MG tablet Take 1 tablet (500 mg total) by mouth daily. Take as prevention.  Take one tablet by mouth twice daily for 3 days for an outbreak. 110 tablet 0   ALPRAZolam (XANAX) 0.5 MG tablet Take 0.5 mg by mouth 2 (two) times daily as needed. (Patient not taking: Reported on 12/06/2022)     Estradiol (VAGIFEM) 10 MCG TABS vaginal tablet Place 1 tablet (10 mcg total) vaginally 2 (two) times a week. Use every night before bed for two weeks when you first begin this medicine, then after the first two weeks, begin using it twice a week. (Patient not taking: Reported on 02/20/2023) 34 tablet 1   No current facility-administered medications for this visit.     ALLERGIES: Patient has no known allergies.  Family History  Problem Relation Age of Onset   Depression Mother    Pancreatic cancer Mother     Diabetes Mother    Obesity Mother    Heart Problems Mother    Depression Father    Cancer Maternal Grandmother    Dementia Maternal Grandmother    Breast cancer Maternal Grandmother    ADD / ADHD Daughter    Cervical cancer Other     Social History   Socioeconomic History   Marital status: Single    Spouse name: Not on file   Number of children: Not on file   Years of education: Not on file   Highest education level: Not on file  Occupational History   Not on file  Tobacco Use   Smoking status: Never   Smokeless tobacco: Never  Vaping Use   Vaping status: Never Used  Substance and Sexual Activity   Alcohol use: Yes    Comment: occasional   Drug use: Never   Sexual activity: Yes    Partners: Male    Birth control/protection: Post-menopausal  Other Topics Concern   Not on file  Social History Narrative   Not on file   Social Determinants of Health   Financial Resource Strain: Not on file  Food Insecurity: Not on file  Transportation Needs: Not on file  Physical Activity: Not on file  Stress: Not on file  Social Connections: Not on file  Intimate Partner Violence: Not on file    Review of Systems  All other systems reviewed and are negative.   PHYSICAL EXAMINATION:   BP 128/84 (BP Location: Right Arm, Patient Position: Sitting, Cuff Size: Normal)   Ht 5\' 4"  (1.626 m)   Wt 181 lb (82.1 kg)   LMP 05/15/2016 (Approximate)   BMI 31.07 kg/m     General appearance: alert, cooperative and appears stated age   Colposcopy - cervix, vagina. Consent for procedure.  3% acetic acid used in vagina and on  cervix. White light and green light filter used.  Colposcopy satisfactory:  Yes   __x___          No    _____ Findings:  posterior cervical lip is much small than anterior cervical lip.  Bladder is prolapsing, approx 1st degree.  Cervix:  acetowhite change at 12:00 and increased vascularity at 6:00.  Vagina:  no lesions.  Biopsies:  ECC, biopsy 6:00, biopsy  12:00 Monsel's placed.  Minimal EBL. No complications.   Chaperone was present for exam:  Warren Lacy, CMA   ASSESSMENT:  LGSIL pap and positive HR HPV.  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  Bladder prolapse.  Stress  incontinence.   PLAN:  We discussed abnormal paps, colposcopy, LEEP/conization of cervix, cervical cancer.  We reviewed that hysterectomy does not eradicate HPV from the genital tract and that HPV may continue to be present and necessitate colposcopy and biopsy of the vagina after hysterectomy is performed.  Due to her cervical anatomy, I would recommend a cold knife conization if colpo bx shows CIN II or III.  Once the colpo biopsies are final, we can plan for potential next steps in care for surgical planning and referral to urogynecology for cystocele and stress incontinence.  She returns for her annual exam in November, and will keep this appointment to keep her care up to date.   25 min  total time was spent for this patient encounter, including preparation, face-to-face counseling with the patient, coordination of care, and documentation of the encounter in addition to doing the colposcopy today.

## 2023-02-19 NOTE — Progress Notes (Deleted)
GYNECOLOGY  VISIT   HPI: 62 y.o.   Single  {Race/ethnicity:17218}  female   [id number] with Patient's last menstrual period was 05/15/2016 (approximate).   here for     GYNECOLOGIC HISTORY: Patient's last menstrual period was 05/15/2016 (approximate). Contraception:  *** Menopausal hormone therapy:  *** Last mammogram:  *** Last pap smear:   ***        OB History     Gravida  2   Para  2   Term  0   Preterm  0   AB  0   Living  2      SAB  0   IAB  0   Ectopic  0   Multiple  0   Live Births  0              Patient Active Problem List   Diagnosis Date Noted   Stress incontinence 09/29/2017   Menopausal symptoms 09/29/2017    Past Medical History:  Diagnosis Date   Depression    Endometrial polyp    Fibromyalgia    GAD (generalized anxiety disorder)    GERD (gastroesophageal reflux disease)    Herpes simplex type 2 infection 2020   History of abnormal cervical Pap smear    History of benign ovarian tumor    age 64s   History of COVID-19 05/12/2019   positive result in epic;  per pt moderate symptoms with pneumonia, no oxygen, daily home health nurse care , mostly resolved in 6 wks then residual's resolved   History of infertility, female    Hyperlipidemia    Lactose intolerance    Multiple thyroid nodules    ultraound in epic 06-03-2020 bilateral nodules did not meet critiria to bx   Ovarian hyperstimulation syndrome    PCOS (polycystic ovarian syndrome)    PMB (postmenopausal bleeding)    SUI (stress urinary incontinence, female)    Wears glasses     Past Surgical History:  Procedure Laterality Date   ACDF   06/01/2022   BREAST REDUCTION SURGERY Bilateral 2004   COLPOSCOPY     DILATATION & CURETTAGE/HYSTEROSCOPY WITH MYOSURE N/A 02/22/2021   Procedure: DILATATION & CURETTAGE/HYSTEROSCOPY WITH MYOSURE;  Surgeon: Patton Salles, MD;  Location: [facility] ;  Service: Gynecology;  Laterality: N/A;   DILATION AND  CURETTAGE OF UTERUS     teen   FERTILITY SURGERY     per pt multiple infertility surgery's via laparosocpy's/ laparotomy's,  last one approx age 38s    Current Outpatient Medications  Medication Sig Dispense Refill   ALPRAZolam (XANAX) 0.5 MG tablet Take 0.5 mg by mouth 2 (two) times daily as needed. (Patient not taking: Reported on 12/06/2022)     busPIRone (BUSPAR) 15 MG tablet Take 15 mg by mouth 2 (two) times daily.     Estradiol (VAGIFEM) 10 MCG TABS vaginal tablet Place 1 tablet (10 mcg total) vaginally 2 (two) times a week. Use every night before bed for two weeks when you first begin this medicine, then after the first two weeks, begin using it twice a week. 34 tablet 1   gabapentin (NEURONTIN) 300 MG capsule Take 1 capsule (300 mg) nightly. 90 capsule 3   methocarbamol (ROBAXIN) 500 MG tablet Take 500 mg by mouth 3 (three) times daily as needed.     Multiple Vitamin (MULTIVITAMIN) capsule Take 1 capsule by mouth daily.     valACYclovir (VALTREX) 500 MG tablet Take 1 tablet (500 mg total) by  mouth daily. Take as prevention.  Take one tablet by mouth twice daily for 3 days for an outbreak. 110 tablet 0   No current facility-administered medications for this visit.     ALLERGIES: Patient has no known allergies.  Family History  Problem Relation Age of Onset   Depression Mother    Pancreatic cancer Mother    Diabetes Mother    Obesity Mother    Heart Problems Mother    Depression Father    Cancer Maternal Grandmother    Dementia Maternal Grandmother    Breast cancer Maternal Grandmother    ADD / ADHD Daughter    Cervical cancer Other     Social History   Socioeconomic History   Marital status: Single    Spouse name: Not on file   Number of children: Not on file   Years of education: Not on file   Highest education level: Not on file  Occupational History   Not on file  Tobacco Use   Smoking status: Never   Smokeless tobacco: Never  Vaping Use   Vaping status:  Never Used  Substance and Sexual Activity   Alcohol use: Yes    Comment: occasional   Drug use: Never   Sexual activity: Yes    Partners: Male    Birth control/protection: Post-menopausal  Other Topics Concern   Not on file  Social History Narrative   Not on file   Social Determinants of Health   Financial Resource Strain: Not on file  Food Insecurity: Not on file  Transportation Needs: Not on file  Physical Activity: Not on file  Stress: Not on file  Social Connections: Not on file  Intimate Partner Violence: Not on file    Review of Systems  PHYSICAL EXAMINATION:    LMP 05/15/2016 (Approximate)     General appearance: alert, cooperative and appears stated age Head: Normocephalic, without obvious abnormality, atraumatic Neck: no adenopathy, supple, symmetrical, trachea midline and thyroid normal to inspection and palpation Lungs: clear to auscultation bilaterally Breasts: normal appearance, no masses or tenderness, No nipple retraction or dimpling, No nipple discharge or bleeding, No axillary or supraclavicular adenopathy Heart: regular rate and rhythm Abdomen: soft, non-tender, no masses,  no organomegaly Extremities: extremities normal, atraumatic, no cyanosis or edema Skin: Skin color, texture, turgor normal. No rashes or lesions Lymph nodes: Cervical, supraclavicular, and axillary nodes normal. No abnormal inguinal nodes palpated Neurologic: Grossly normal  Pelvic: External genitalia:  no lesions              Urethra:  normal appearing urethra with no masses, tenderness or lesions              Bartholins and Skenes: normal                 Vagina: normal appearing vagina with normal color and discharge, no lesions              Cervix: no lesions                Bimanual Exam:  Uterus:  normal size, contour, position, consistency, mobility, non-tender              Adnexa: no mass, fullness, tenderness              Rectal exam: {yes no:314532}.  Confirms.               Anus:  normal sphincter tone, no lesions  Chaperone was present for exam:  ***  ASSESSMENT     PLAN     An After Visit Summary was printed and given to the patient.  ______ minutes face to face time of which over 50% was spent in counseling.

## 2023-02-20 ENCOUNTER — Other Ambulatory Visit (HOSPITAL_COMMUNITY)
Admission: RE | Admit: 2023-02-20 | Discharge: 2023-02-20 | Disposition: A | Discharge status: Home / Self Care | Payer: BC Managed Care – PPO | Source: Ambulatory Visit | Attending: Obstetrics and Gynecology | Admitting: Obstetrics and Gynecology

## 2023-02-20 ENCOUNTER — Ambulatory Visit (INDEPENDENT_AMBULATORY_CARE_PROVIDER_SITE_OTHER): Payer: BC Managed Care – PPO | Admitting: Obstetrics and Gynecology

## 2023-02-20 ENCOUNTER — Encounter: Payer: Self-pay | Admitting: Obstetrics and Gynecology

## 2023-02-20 VITALS — BP 128/84 | Ht 64.0 in | Wt 181.0 lb

## 2023-02-20 DIAGNOSIS — N393 Stress incontinence (female) (male): Secondary | ICD-10-CM

## 2023-02-20 DIAGNOSIS — R87612 Low grade squamous intraepithelial lesion on cytologic smear of cervix (LGSIL): Secondary | ICD-10-CM

## 2023-02-20 DIAGNOSIS — R87810 Cervical high risk human papillomavirus (HPV) DNA test positive: Secondary | ICD-10-CM

## 2023-02-20 DIAGNOSIS — N8110 Cystocele, unspecified: Secondary | ICD-10-CM | POA: Diagnosis not present

## 2023-02-20 NOTE — Patient Instructions (Signed)
Colposcopy, Care After  The following information offers guidance on how to care for yourself after your procedure. Your doctor may also give you more specific instructions. If you have problems or questions, contact your doctor. What can I expect after the procedure? If you did not have a sample of your tissue taken out (did not have a biopsy), you may only have some spotting of blood for a few days. You can go back to your normal activities. If you had a sample of your tissue taken out, it is common to have: Soreness and mild pain. These may last for a few days. Mild bleeding or fluid (discharge) coming from your vagina. The fluid will look dark and grainy. You may have this for a few days. The fluid may be caused by a liquid that was used during your procedure. You may need to wear a sanitary pad. Spotting of blood for at least 48 hours after the procedure. Follow these instructions at home: Medicines Take over-the-counter and prescription medicines only as told by your doctor. Ask your doctor what over-the-counter pain medicines and prescription medicines you can start taking again. This is very important if you take blood thinners. Activity For at least 3 days, or for as long as told by your doctor, avoid: Douching. Using tampons. Having sex. Return to your normal activities as told by your doctor. Ask your doctor what activities are safe for you. General instructions Ask your doctor if you may take baths, swim, or use a hot tub. You may take showers. If you use birth control (contraception), keep using it. Keep all follow-up visits. Contact a doctor if: You have a fever or chills. You faint or feel light-headed. Get help right away if: You bleed a lot from your vagina. A lot of bleeding means that the bleeding soaks through a pad in less than 1 hour. You have clumps of blood (blood clots) coming from your vagina. You have signs that could mean you have an infection. This may be  fluid coming from your vagina that is: Different than normal. Yellow. Bad-smelling. You have very bad pain or cramps in your lower belly that do not get better with medicine. Summary If you did not have a sample of your tissue taken out, you may only have some spotting of blood for a few days. You can go back to your normal activities. If you had a sample of your tissue taken out, it is common to have mild pain for a few days and spotting for 48 hours. Avoid douching, using tampons, and having sex for at least 3 days after the procedure or for as long as told. Get help right away if you have a lot of bleeding, very bad pain, or signs of infection. This information is not intended to replace advice given to you by your health care provider. Make sure you discuss any questions you have with your health care provider. Document Revised: 09/26/2020 Document Reviewed: 09/26/2020 Elsevier Patient Education  2024 [employer]  

## 2023-02-22 LAB — SURGICAL PATHOLOGY

## 2023-02-23 ENCOUNTER — Encounter: Payer: Self-pay | Admitting: Obstetrics and Gynecology

## 2023-02-28 ENCOUNTER — Other Ambulatory Visit: Payer: Self-pay | Admitting: *Deleted

## 2023-02-28 DIAGNOSIS — N870 Mild cervical dysplasia: Secondary | ICD-10-CM

## 2023-02-28 DIAGNOSIS — N393 Stress incontinence (female) (male): Secondary | ICD-10-CM

## 2023-02-28 DIAGNOSIS — N8110 Cystocele, unspecified: Secondary | ICD-10-CM

## 2023-02-28 NOTE — Progress Notes (Signed)
Seen by patient Anna Chavez on 02/23/2023 1:18 PM Referral placed.  Patient left message on triage line requesting location of Urogynecology referral. Call returned to patient. Patient unable to talk, MyChart message sent.

## 2023-03-26 ENCOUNTER — Ambulatory Visit: Payer: BC Managed Care – PPO | Admitting: Obstetrics and Gynecology

## 2023-03-29 ENCOUNTER — Other Ambulatory Visit: Payer: Self-pay | Admitting: Obstetrics and Gynecology

## 2023-03-29 DIAGNOSIS — B009 Herpesviral infection, unspecified: Secondary | ICD-10-CM

## 2023-03-29 NOTE — Telephone Encounter (Signed)
Medication refill request: Valtrex  Last OV:  12/06/22 Next AEX: 07/09/23 Last MMG (if hormonal medication request): n/a Refill authorized: #[id number] [id number]

## 2023-04-20 ENCOUNTER — Ambulatory Visit: Payer: BC Managed Care – PPO | Admitting: Obstetrics

## 2023-04-20 ENCOUNTER — Telehealth: Payer: Self-pay

## 2023-04-20 NOTE — Telephone Encounter (Signed)
Med refill request: estradiol tabs vaginal tablet Last AEX: 04/04/2021 Next AEX: 07/09/2023 Last MMG (if hormonal med) 07/06/2022 Refill authorized: Last Rx sent #[id number] with 1 refill on 12/07/2022. Please approve or deny as appropriate.

## 2023-04-23 ENCOUNTER — Other Ambulatory Visit: Payer: Self-pay | Admitting: Obstetrics and Gynecology

## 2023-04-23 MED ORDER — ESTRADIOL 10 MCG VA TABS: 1.0000 | ORAL_TABLET | VAGINAL | 0 refills | Status: DC

## 2023-04-23 NOTE — Telephone Encounter (Signed)
Rx was refilled by me and sent to pharmacy.

## 2023-04-23 NOTE — Progress Notes (Signed)
Refill of Vagifem.

## 2023-05-25 ENCOUNTER — Encounter: Payer: Self-pay | Admitting: Obstetrics

## 2023-05-25 ENCOUNTER — Ambulatory Visit (INDEPENDENT_AMBULATORY_CARE_PROVIDER_SITE_OTHER): Payer: 59 | Admitting: Obstetrics

## 2023-05-25 VITALS — BP 129/77 | HR 87 | Ht 64.75 in | Wt 186.0 lb

## 2023-05-25 DIAGNOSIS — N950 Postmenopausal bleeding: Secondary | ICD-10-CM

## 2023-05-25 DIAGNOSIS — K5900 Constipation, unspecified: Secondary | ICD-10-CM | POA: Diagnosis not present

## 2023-05-25 DIAGNOSIS — N8110 Cystocele, unspecified: Secondary | ICD-10-CM | POA: Insufficient documentation

## 2023-05-25 DIAGNOSIS — N3946 Mixed incontinence: Secondary | ICD-10-CM | POA: Diagnosis not present

## 2023-05-25 DIAGNOSIS — R159 Full incontinence of feces: Secondary | ICD-10-CM | POA: Insufficient documentation

## 2023-05-25 LAB — POCT URINALYSIS DIPSTICK
Bilirubin, UA: NEGATIVE
Blood, UA: NEGATIVE
Glucose, UA: NEGATIVE
Ketones, UA: NEGATIVE
Leukocytes, UA: NEGATIVE
Nitrite, UA: NEGATIVE
Protein, UA: NEGATIVE
Spec Grav, UA: 1.025 (ref 1.010–1.025)
Urobilinogen, UA: 0.2 U/dL
pH, UA: 5.5 (ref 5.0–8.0)

## 2023-05-25 MED ORDER — GEMTESA 75 MG PO TABS: 75.0000 mg | ORAL_TABLET | Freq: Every day | ORAL | Status: DC

## 2023-05-25 MED ORDER — GEMTESA 75 MG PO TABS: 75.0000 mg | ORAL_TABLET | Freq: Every day | ORAL | 2 refills | Status: AC

## 2023-05-25 NOTE — Assessment & Plan Note (Addendum)
-   For treatment of pelvic organ prolapse, we discussed options for management including expectant management, conservative management, and surgical management, such as Kegels, a pessary, pelvic floor physical therapy, and specific surgical procedures. 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

## 2023-05-25 NOTE — Assessment & Plan Note (Addendum)
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

## 2023-05-25 NOTE — Assessment & Plan Note (Addendum)
-   stress > urgency symptoms - POCT UA negative, bladder scan WNL at 15mL - For treatment of stress urinary incontinence,  non-surgical options include expectant management, weight loss, physical therapy, as well as a pessary.  Surgical options include a midurethral sling, Burch urethropexy, and transurethral injection of a bulking agent. We discussed the symptoms of overactive bladder (OAB), which include urinary urgency, urinary frequency, nocturia, with or without urge incontinence.  While we do not know the exact etiology of OAB, several treatment options exist. We discussed management including behavioral therapy (decreasing bladder irritants, urge suppression strategies, timed voids, bladder retraining), physical therapy, medication; for refractory cases posterior tibial nerve stimulation, sacral neuromodulation, and intravesical botulinum toxin injection.  For anticholinergic medications, we discussed the potential side effects of anticholinergics including dry eyes, dry mouth, constipation, cognitive impairment and urinary retention. For Beta-3 agonist medication, we discussed the potential side effect of elevated blood pressure which is more likely to occur in individuals with uncontrolled hypertension. - samples and Rx for trial of gemtesa  if refractory symptoms after Kegels and treatment of constipation - encouraged fluid management

## 2023-05-25 NOTE — Assessment & Plan Note (Signed)
-   rare episodes of leakage - Treatment options include anti-diarrhea medication (loperamide/ Imodium OTC or prescription lomotil), fiber supplements, physical therapy, and possible sacral neuromodulation or surgery.   - encouraged titration of fiber supplementation

## 2023-05-25 NOTE — Progress Notes (Addendum)
 gem New Patient Evaluation and Consultation  Referring Provider: Cathlyn JAYSON Nikki Bobie* PCP: Cleotilde Planas, MD Date of Service: 05/25/2023  SUBJECTIVE Chief Complaint: New Patient (Initial Visit)  History of Present Illness: Anna Chavez is a 63 y.o. White or Caucasian female seen in consultation at the request of Dr Cathlyn JAYSON Nikki for evaluation of hysterectomy and pelvic floor issues.    Patient reports poor memory Urinary leakage started after pregnancies for more than 10 years ago, worsened in the past few years. PMB s/p hysteroscopy D&C with myosure noted benign 7mm endometrial polyp at posterior fundus in 02/22/21 with CIN I on colposcopy 02/20/23.  TVUS 01/20/21  Uterus 10.46 x 6.5 x 4.3 cm. 3.5 x 2.7 cm pedunculated fibroid near right adnexa. EMS 5.04 mm, irregular with feeder vessel. 3D imaging suggesting endometrial mass.  Ovaries atrophic. No free fluid. Continues to report bleeding at the time of intercourse after period of abstinence, reports improvement of symptoms after starting vaginal estrogen. Take valtrex  daily for HSV  Review of records significant for: Fibromyalgia with generalized joint and muscle aching  Urinary Symptoms: Leaks urine with cough/ sneeze, laughing, exercise, lifting, and with urgency Leaks 2-3 time(s) per days with activity, leaks 3-4x/year with urgency Pad use:  2-3  liners/ mini-pads per day.   Patient is bothered by UI symptoms.  Day time voids 7-10, increased in the past few years.  Nocturia: 1 times per night to void. Voiding dysfunction:  empties bladder well.  Patient does not use a catheter to empty bladder.  When urinating, patient feels the need to urinate multiple times in a row Drinks: 64oz water per day, 1 cup of coffee, occasional 1 bottle of tea/day  UTIs:  0  UTI's in the last year.   Denies history of blood in urine, kidney or bladder stones, pyelonephritis, bladder cancer, and kidney cancer No results found for  the last 90 days.   Pelvic Organ Prolapse Symptoms:                  Patient Admits to a feeling of a bulge at the vaginal opening. It has been present for >1 years when she checks with a mirror. Patient Denies seeing a bulge.  This bulge is bothersome.  Bowel Symptom: Bowel movements: 1-2 time(s) per day with constipation with IBS Stool consistency: hard Straining: yes.  Splinting: no.  Incomplete evacuation: yes.  Patient Admits to accidental bowel leakage / fecal incontinence  Occurs: 2-3 time(s) per year  Consistency with leakage: soft , liquid Bowel regimen: none, used miralax and senna in the past. Last Cologuard. Date 11/07/17, Results negative. Cancelled GI appointment last year, pending PCP appt next week to review.  HM Colonoscopy   This patient has no relevant Health Maintenance data.     Sexual Function Sexually active: yes.  Sexual orientation: Straight Pain with sex: No  Pelvic Pain Denies pelvic pain  Past Medical History:  Past Medical History:  Diagnosis Date   CIN I (cervical intraepithelial neoplasia I)    Depression    Endometrial polyp    Fibromyalgia    GAD (generalized anxiety disorder)    GERD (gastroesophageal reflux disease)    Herpes simplex type 2 infection 2020   History of abnormal cervical Pap smear    History of benign ovarian tumor    age 60s   History of COVID-19 05/12/2019   positive result in epic;  per pt moderate symptoms with pneumonia, no oxygen, daily home health nurse  care , mostly resolved in 6 wks then residual's resolved   History of infertility, female    Hyperlipidemia    Lactose intolerance    Multiple thyroid  nodules    ultraound in epic 06-03-2020 bilateral nodules did not meet critiria to bx   Ovarian hyperstimulation syndrome    PCOS (polycystic ovarian syndrome)    PMB (postmenopausal bleeding)    SUI (stress urinary incontinence, female)    Wears glasses      Past Surgical History:   Past Surgical  History:  Procedure Laterality Date   ABDOMINOPLASTY     for diastasis recti   ACDF   06/01/2022   BREAST REDUCTION SURGERY Bilateral 2004   COLPOSCOPY     DILATATION & CURETTAGE/HYSTEROSCOPY WITH MYOSURE N/A 02/22/2021   Procedure: DILATATION & CURETTAGE/HYSTEROSCOPY WITH MYOSURE;  Surgeon: Cathlyn JAYSON Nikki Bobie FORBES, MD;  Location: [facility] ;  Service: Gynecology;  Laterality: N/A;   DILATION AND CURETTAGE OF UTERUS     teen   FERTILITY SURGERY     per pt multiple infertility surgery's via laparosocpy's/ laparotomy's,  last one approx age 12s     Past OB/GYN History: OB History  Gravida Para Term Preterm AB Living  2 2 0 0 0 2  SAB IAB Ectopic Multiple Live Births  0 0 0 0 2    # Outcome Date GA Lbr Len/2nd Weight Sex Type Anes PTL Lv  2 Para     F Vag-Spont     1 Para     F Vag-Spont       Vaginal deliveries: 2, reports perineal laceration repair with 9lb infant. Forceps/ Vacuum deliveries: 0, Cesarean section: 0 Menopausal: Yes, at age 45s, Admits to vaginal bleeding since menopause Reports blood with postvoid wipe after resuming intercourse several times in the past year Contraception: none. Last pap smear. Colpo 02/20/23 with CIN I.  Any history of abnormal pap smears: yes. LGSIL with HR HPV    Component Value Date/Time   DIAGPAP - Low grade squamous intraepithelial lesion (LSIL) (A) 12/06/2022 1011   DIAGPAP - Low grade squamous intraepithelial lesion (LSIL) (A) 04/04/2021 1456   DIAGPAP (A) 06/11/2020 0848    - Atypical squamous cells of undetermined significance (ASC-US )   DIAGPAP - See comment (A) 06/11/2020 0848   HPVHIGH Positive (A) 12/06/2022 1011   HPVHIGH Positive (A) 04/04/2021 1456   HPVHIGH Positive (A) 06/11/2020 0848   ADEQPAP  12/06/2022 1011    Satisfactory for evaluation; transformation zone component PRESENT.   ADEQPAP  04/04/2021 1456    Satisfactory for evaluation; transformation zone component PRESENT.   ADEQPAP  06/11/2020  0848    Satisfactory for evaluation; transformation zone component PRESENT.    SURGICAL PATHOLOGY CASE: [id number] PATIENT: RENNA LAMP Surgical Pathology Report     Clinical History: LGSIL, positive HR HPV (cm)     FINAL MICROSCOPIC DIAGNOSIS:  A. ENDOCERVIX, CURETTAGE: Mucoinflammatory and squamous debris with abundant admixed benign endocervical epithelium and endocervix  B. CERVIX, 6:00, BIOPSY: Detached fragments of mild squamous dysplasia within metaplasia (LSIL, CIN-1) Mucohemorrhagic and inflammatory debris with admixed benign endocervical epithelium  C. CERVIX, 12:00, BIOPSY: Mild squamous dysplasia with HPV effect (LSIL, CIN-1)    Medications: Patient has a current medication list which includes the following prescription(s): alprazolam, buspirone, duloxetine, gabapentin , multivitamin, valacyclovir , gemtesa , and gemtesa .   Allergies: Patient has no known allergies.   Social History:  Social History   Tobacco Use   Smoking status: Never   Smokeless tobacco:  Never  Vaping Use   Vaping status: Never Used  Substance Use Topics   Alcohol use: Yes    Comment: occasional   Drug use: Never    Relationship status: single Patient lives by herself.   Patient is employed as a writer. Regular exercise: Yes: walk and workout History of abuse: No  Family History:   Family History  Problem Relation Age of Onset   Depression Mother    Pancreatic cancer Mother    Diabetes Mother    Obesity Mother    Heart Problems Mother    Depression Father    Cancer Maternal Grandmother    Dementia Maternal Grandmother    Breast cancer Maternal Grandmother    ADD / ADHD Daughter    Cervical cancer Other      Review of Systems: Review of Systems  Constitutional:  Positive for malaise/fatigue. Negative for fever and weight loss.  Respiratory:  Positive for cough. Negative for shortness of breath and wheezing.   Cardiovascular:  Negative for chest pain,  palpitations and leg swelling.  Gastrointestinal:  Positive for blood in stool and constipation. Negative for abdominal pain.  Genitourinary:  Negative for dysuria, frequency, hematuria and urgency.  Skin:  Negative for rash.  Neurological:  Negative for dizziness, weakness and headaches.  Endo/Heme/Allergies:  Does not bruise/bleed easily.       Hot flashes  Psychiatric/Behavioral:  Positive for depression. The patient is nervous/anxious.      OBJECTIVE Physical Exam: Vitals:   05/25/23 0820  BP: 129/77  Pulse: 87  Weight: 186 lb (84.4 kg)  Height: 5' 4.75 (1.645 m)    Physical Exam Constitutional:      General: She is not in acute distress.    Appearance: Normal appearance.  Genitourinary:     Bladder and urethral meatus normal.     No lesions in the vagina.     Right Labia: No rash, tenderness, lesions, skin changes or Bartholin's cyst.    Left Labia: No tenderness, lesions, skin changes, Bartholin's cyst or rash.    No vaginal discharge, erythema, tenderness, bleeding, ulceration or granulation tissue.     Anterior vaginal prolapse present.     Right Adnexa: not tender, not full and no mass present.    Left Adnexa: not tender, not full and no mass present.    No cervical motion tenderness, discharge, friability, lesion, polyp or nabothian cyst.     Uterus is not enlarged, fixed, tender or irregular.     No uterine mass detected.    Urethral meatus caruncle not present.    No urethral prolapse, tenderness, mass, hypermobility, discharge or stress urinary incontinence with cough stress test present.     Bladder is not tender, urgency on palpation not present and masses not present.      Pelvic Floor: Levator muscle strength is 3/5.    Pelvic spasms present.     Levator ani not tender, obturator internus not tender and no asymmetrical contractions present.    Symmetrical pelvic sensation, anal wink present and BC reflex present. Cardiovascular:     Rate and Rhythm:  Normal rate.  Pulmonary:     Effort: Pulmonary effort is normal. No respiratory distress.  Abdominal:     General: There is no distension.     Palpations: Abdomen is soft. There is no mass.     Tenderness: There is no abdominal tenderness.     Hernia: No hernia is present.    Neurological:  Mental Status: She is alert.  Vitals reviewed. Exam conducted with a chaperone present.      POP-Q:   POP-Q  -1                                            Aa   -1                                           Ba  -7                                              C   2                                            Gh  1                                            Pb  9                                            tvl   -1                                            Ap  -1                                            Bp  -8                                              D    Post-Void Residual (PVR) by Bladder Scan: In order to evaluate bladder emptying, we discussed obtaining a postvoid residual and patient agreed to this procedure.  Procedure: The ultrasound unit was placed on the patient's abdomen in the suprapubic region after the patient had voided.    Post Void Residual - 05/25/23 0841       Post Void Residual   Post Void Residual 15 mL              Laboratory Results: Lab Results  Component Value Date   COLORU Yellow 05/25/2023   CLARITYU Clear 05/25/2023   GLUCOSEUR Negative 05/25/2023   BILIRUBINUR Negative 05/25/2023   KETONESU Negative 05/25/2023   SPECGRAV 1.025 05/25/2023   RBCUR Negative 05/25/2023   PHUR 5.5 05/25/2023   PROTEINUR Negative 05/25/2023   UROBILINOGEN 0.2 05/25/2023   LEUKOCYTESUR Negative 05/25/2023    Lab Results  Component Value Date  CREATININE 0.63 02/18/2021   CREATININE 0.69 10/15/2020   CREATININE 0.88 05/19/2019    Lab Results  Component Value Date   HGBA1C 5.2 10/15/2020    Lab Results  Component Value Date   HGB  13.4 02/18/2021     ASSESSMENT AND PLAN Ms. Nesmith is a 63 y.o. with:  1. Mixed stress and urge urinary incontinence   2. Constipation, unspecified constipation type   3. Incontinence of feces, unspecified fecal incontinence type   4. Pelvic organ prolapse quantification stage 2 cystocele   5. Postmenopausal bleeding     Mixed stress and urge urinary incontinence Assessment & Plan: - stress > urgency symptoms - POCT UA negative, bladder scan WNL at 15mL - For treatment of stress urinary incontinence,  non-surgical options include expectant management, weight loss, physical therapy, as well as a pessary.  Surgical options include a midurethral sling, Burch urethropexy, and transurethral injection of a bulking agent. We discussed the symptoms of overactive bladder (OAB), which include urinary urgency, urinary frequency, nocturia, with or without urge incontinence.  While we do not know the exact etiology of OAB, several treatment options exist. We discussed management including behavioral therapy (decreasing bladder irritants, urge suppression strategies, timed voids, bladder retraining), physical therapy, medication; for refractory cases posterior tibial nerve stimulation, sacral neuromodulation, and intravesical botulinum toxin injection.  For anticholinergic medications, we discussed the potential side effects of anticholinergics including dry eyes, dry mouth, constipation, cognitive impairment and urinary retention. For Beta-3 agonist medication, we discussed the potential side effect of elevated blood pressure which is more likely to occur in individuals with uncontrolled hypertension. - samples and Rx for trial of gemtesa  if refractory symptoms after Kegels and treatment of constipation - encouraged fluid management   Orders: -     POCT urinalysis dipstick -     Gemtesa ; Take 1 tablet (75 mg total) by mouth daily.  Dispense: 30 tablet; Refill: 2 -     Gemtesa ; Take 1 tablet (75 mg  total) by mouth daily.  Constipation, unspecified constipation type Assessment & Plan: - For constipation, we reviewed the importance of a better bowel regimen and association with pelvic floor symptoms.  We also discussed the importance of avoiding chronic straining, as it can exacerbate her pelvic floor symptoms; we discussed treating constipation and straining prior to surgery, as postoperative straining can lead to damage to the repair and recurrence of symptoms. We discussed initiating therapy with increasing fluid intake, fiber supplementation, stool softeners, and laxatives such as miralax.  - encouraged squatting position and relaxation during bowel movements  - encouraged to consider pelvic floor PT   Incontinence of feces, unspecified fecal incontinence type Assessment & Plan: - rare episodes of leakage - Treatment options include anti-diarrhea medication (loperamide/ Imodium OTC or prescription lomotil), fiber supplements, physical therapy, and possible sacral neuromodulation or surgery.   - encouraged titration of fiber supplementation   Pelvic organ prolapse quantification stage 2 cystocele Assessment & Plan: - For treatment of pelvic organ prolapse, we discussed options for management including expectant management, conservative management, and surgical management, such as Kegels, a pessary, pelvic floor physical therapy, and specific surgical procedures. 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    Postmenopausal bleeding Assessment & Plan: - known CIN I on colposcopy 02/20/23 - hysteroscopy D&C 02/22/21 with benign polyp - postcoital bleeding in 2024 when she resumed sexual activity - discussed likely due to atrophy, however cannot rule out endometrial pathology - TVUS ordered, discussed need to repeat EMB or D&C if persistent symptoms despite normal TVUS findings.  Orders: -     US  PELVIS TRANSVAGINAL NON-OB (TV ONLY); Future  Time spent: I spent 73 minutes dedicated to the care of this patient on the date of this encounter to include pre-visit review of records, face-to-face time with the patient discussing pelvic organ prolapse, mixed urinary incontinence, postmenopausal bleeding, fecal incontinence, constipation, and post visit documentation and ordering medication/ testing.   Lianne ONEIDA Gillis, MD

## 2023-05-25 NOTE — Assessment & Plan Note (Signed)
-   For constipation, we reviewed the importance of a better bowel regimen and association with pelvic floor symptoms.  We also discussed the importance of avoiding chronic straining, as it can exacerbate her pelvic floor symptoms; we discussed treating constipation and straining prior to surgery, as postoperative straining can lead to damage to the repair and recurrence of symptoms. We discussed initiating therapy with increasing fluid intake, fiber supplementation, stool softeners, and laxatives such as miralax.  - encouraged squatting position and relaxation during bowel movements  - encouraged to consider pelvic floor PT

## 2023-05-25 NOTE — Patient Instructions (Signed)
 You have a stage 2 (out of 4) prolapse.  We discussed the fact that it is not life threatening but there are several treatment options. For treatment of pelvic organ prolapse, we discussed options for management including expectant management, conservative management, and surgical management, such as Kegels, a pessary, pelvic floor physical therapy, and specific surgical procedures.     We discussed the symptoms of overactive bladder (OAB), which include urinary urgency, urinary frequency, night-time urination, with or without urge incontinence.  We discussed management including behavioral therapy (decreasing bladder irritants by following a bladder diet, urge suppression strategies, timed voids, bladder retraining), physical therapy, medication; and for refractory cases posterior tibial nerve stimulation, sacral neuromodulation, and intravesical botulinum toxin injection.   For Beta-3 agonist medication, we discussed the potential side effect of elevated blood pressure which is more likely to occur in individuals with uncontrolled hypertension. You were given samples for Gemtesa  75 mg.  It can take a month to start working so give it time, but if you have bothersome side effects call sooner and we can try a different medication.  Call us  if you have trouble filling the prescription or if it's not covered by your insurance.  For treatment of stress urinary incontinence, which is leakage with physical activity/movement/strainging/coughing, we discussed expectant management versus nonsurgical options versus surgery. Nonsurgical options include weight loss, physical therapy, as well as a pessary.  Surgical options include a midurethral sling, which is a synthetic mesh sling that acts like a hammock under the urethra to prevent leakage of urine, a Burch urethropexy, and transurethral injection of a bulking agent.   Constipation: Our goal is to achieve formed bowel movements daily or every-other-day.  You may  need to try different combinations of the following options to find what works best for you - everybody's body works differently so feel free to adjust the dosages as needed.  Some options to help maintain bowel health include:  Dietary changes (more leafy greens, vegetables and fruits; less processed foods) Fiber supplementation (Benefiber, FiberCon, Metamucil or Psyllium). Start slow and increase gradually to full dose. Over-the-counter agents such as: stool softeners (Docusate or Colace) and/or laxatives (Miralax, milk of magnesia)  Power Pudding is a natural mixture that may help your constipation.  To make blend 1 cup applesauce, 1 cup wheat bran, and 3/4 cup prune juice, refrigerate and then take 1 tablespoon daily with a large glass of water as needed.   Women should try to eat at least 21 to 25 grams of fiber a day, while men should aim for 30 to 38 grams a day. You can add fiber to your diet with food or a fiber supplement such as psyllium (metamucil), benefiber, or fibercon.   Here's a look at how much dietary fiber is found in some common foods. When buying packaged foods, check the Nutrition Facts label for fiber content. It can vary among brands.  Fruits Serving size Total fiber (grams)*  Raspberries 1 cup 8.0  Pear 1 medium 5.5  Apple, with skin 1 medium 4.5  Banana 1 medium 3.0  Orange 1 medium 3.0  Strawberries 1 cup 3.0   Vegetables Serving size Total fiber (grams)*  Green peas, boiled 1 cup 9.0  Broccoli, boiled 1 cup chopped 5.0  Turnip greens, boiled 1 cup 5.0  Brussels sprouts, boiled 1 cup 4.0  Potato, with skin, baked 1 medium 4.0  Sweet corn, boiled 1 cup 3.5  Cauliflower, raw 1 cup chopped 2.0  Carrot, raw 1 medium  1.5   Grains Serving size Total fiber (grams)*  Spaghetti, whole-wheat, cooked 1 cup 6.0  Barley, pearled, cooked 1 cup 6.0  Bran flakes 3/4 cup 5.5  Quinoa, cooked 1 cup 5.0  Oat bran muffin 1 medium 5.0  Oatmeal, instant, cooked 1 cup 5.0   Popcorn, air-popped 3 cups 3.5  Brown rice, cooked 1 cup 3.5  Bread, whole-wheat 1 slice 2.0  Bread, rye 1 slice 2.0   Legumes, nuts and seeds Serving size Total fiber (grams)*  Split peas, boiled 1 cup 16.0  Lentils, boiled 1 cup 15.5  Black beans, boiled 1 cup 15.0  Baked beans, canned 1 cup 10.0  Chia seeds 1 ounce 10.0  Almonds 1 ounce (23 nuts) 3.5  Pistachios 1 ounce (49 nuts) 3.0  Sunflower kernels 1 ounce 3.0  *Rounded to nearest 0.5 gram. Source: [employer] for Standard Reference, Legacy Release    Please schedule your transvaginal ultrasound for postmenopausal bleeding.   If you continue to have bleeding after a normal ultrasound, you will need to proceed with endometrial biopsy.

## 2023-06-04 ENCOUNTER — Ambulatory Visit (HOSPITAL_BASED_OUTPATIENT_CLINIC_OR_DEPARTMENT_OTHER): Payer: 59

## 2023-06-21 LAB — EXTERNAL GENERIC LAB PROCEDURE: COLOGUARD: NEGATIVE

## 2023-06-21 LAB — COLOGUARD: COLOGUARD: NEGATIVE

## 2023-06-25 NOTE — Progress Notes (Unsigned)
 63 y.o. [id number] Single Caucasian female here for annual exam.  Pt noted two weeks ago a lump in Right breast, no pain.  Her PCP office has already ordered the dx mammogram and ultrasound.   Saw urogyn for pelvic organ prolapse and mixed incontinence.  Did not start Gemtesa due to cost.  She received an Rx for Myrbetriq but she has not started this yet.   Has had postcoital bleeding.  This stopped.  She has a pelvic US ordered by urogyn.   She is using vaginal estradiol tabs twice weekly at bedtime to treat postcoital bleeding due to atrophy.  In a new and supportive relationship.  Taking Neurontin through another provider.  She has chronic neck pain.   Her daughter is having health issues.   PCP: Sigmund Hazel, MD   Patient's last menstrual period was 05/15/2016 (approximate).           Sexually active: Yes.    The current method of family planning is post menopausal status.    Menopausal hormone therapy:  n/a Exercising: Yes.     Gym 3-4x a week Smoker:  no  OB History  Gravida Para Term Preterm AB Living  2 2 0 0 0 2  SAB IAB Ectopic Multiple Live Births  0 0 0 0 2    # Outcome Date GA Lbr Len/2nd Weight Sex Type Anes PTL Lv  2 Para     F Vag-Spont     1 Para     F Vag-Spont        HEALTH MAINTENANCE: Last 2 paps:  12/06/22 LSIL: HR HPV positive, 04/04/21 LSIL: HR HPV positive History of abnormal Pap or positive HPV:  yes.  Last colposcopy 02/21/23:  LGSIL, ECC benign.  Mammogram: has not been able to schedule, 07/06/22 Breast density cat B, BI-RADS CAT 1 neg Colonoscopy:  cologuard 2025 per pt - normal per patient Bone Density:  n/a  Result  n/a   Immunization History  Administered Date(s) Administered   Influenza,inj,Quad PF,6+ Mos 06/11/2020, 04/04/2021   Influenza,inj,quad, With Preservative 03/01/2018   PFIZER(Purple Top)SARS-COV-2 Vaccination 07/04/2019, 07/29/2019   Tdap 09/27/2017   Zoster Recombinant(Shingrix) 03/01/2018, 05/13/2018      reports  that she has never smoked. She has never used smokeless tobacco. She reports current alcohol use. She reports that she does not use drugs.  Past Medical History:  Diagnosis Date   CIN I (cervical intraepithelial neoplasia I)    Depression    Endometrial polyp    Fibromyalgia    GAD (generalized anxiety disorder)    GERD (gastroesophageal reflux disease)    Herpes simplex type 2 infection 2020   History of abnormal cervical Pap smear    History of benign ovarian tumor    age 65s   History of COVID-19 05/12/2019   positive result in epic;  per pt moderate symptoms with pneumonia, no oxygen, daily home health nurse care , mostly resolved in 6 wks then residual's resolved   History of infertility, female    Hyperlipidemia    Lactose intolerance    Multiple thyroid nodules    ultraound in epic 06-03-2020 bilateral nodules did not meet critiria to bx   Ovarian hyperstimulation syndrome    PCOS (polycystic ovarian syndrome)    PMB (postmenopausal bleeding)    SUI (stress urinary incontinence, female)    Wears glasses     Past Surgical History:  Procedure Laterality Date   ABDOMINOPLASTY     for diastasis recti  ACDF   06/01/2022   BREAST REDUCTION SURGERY Bilateral 2004   COLPOSCOPY     DILATATION & CURETTAGE/HYSTEROSCOPY WITH MYOSURE N/A 02/22/2021   Procedure: DILATATION & CURETTAGE/HYSTEROSCOPY WITH MYOSURE;  Surgeon: Patton Salles, MD;  Location: [facility];  Service: Gynecology;  Laterality: N/A;   DILATION AND CURETTAGE OF UTERUS     teen   FERTILITY SURGERY     per pt multiple infertility surgery's via laparosocpy's/ laparotomy's,  last one approx age 22s    Current Outpatient Medications  Medication Sig Dispense Refill   ALPRAZolam (XANAX) 0.5 MG tablet Take 0.5 mg by mouth 2 (two) times daily as needed.     busPIRone (BUSPAR) 15 MG tablet Take 15 mg by mouth 2 (two) times daily.     DULoxetine (CYMBALTA) 20 MG capsule Take 40 mg by mouth  daily.     gabapentin (NEURONTIN) 300 MG capsule Take 1 capsule (300 mg) nightly. (Patient taking differently: Take 300 mg by mouth at bedtime. Take 1 capsule (300 mg) nightly.) 90 capsule 3   Multiple Vitamin (MULTIVITAMIN) capsule Take 1 capsule by mouth daily.     Estradiol 10 MCG TABS vaginal tablet Place 1 tablet (10 mcg total) vaginally 2 (two) times a week. 24 tablet 0   mirabegron ER (MYRBETRIQ) 25 MG TB24 tablet Take 1 tablet (25 mg total) by mouth daily. (Patient not taking: Reported on 07/09/2023) 30 tablet 0   valACYclovir (VALTREX) 500 MG tablet Take 1 tablet (500 mg total) by mouth daily. Take one tablet by mouth twice a day for 3 days for an outbreak. 110 tablet 3   Vibegron (GEMTESA) 75 MG TABS Take 1 tablet (75 mg total) by mouth daily. (Patient not taking: Reported on 07/09/2023) 30 tablet 2   No current facility-administered medications for this visit.    ALLERGIES: Patient has no known allergies.  Family History  Problem Relation Age of Onset   Depression Mother    Pancreatic cancer Mother    Diabetes Mother    Obesity Mother    Heart Problems Mother    Depression Father    Cancer Maternal Grandmother    Dementia Maternal Grandmother    Breast cancer Maternal Grandmother    ADD / ADHD Daughter    Cervical cancer Other     Review of Systems  All other systems reviewed and are negative.   PHYSICAL EXAM:  BP 122/68 (BP Location: Right Arm, Patient Position: Sitting, Cuff Size: Small)   Pulse 89   Ht 5\' 4"  (1.626 m)   Wt 186 lb (84.4 kg)   LMP 05/15/2016 (Approximate)   SpO2 97%   BMI 31.93 kg/m     General appearance: alert, cooperative and appears stated age Head: normocephalic, without obvious abnormality, atraumatic Neck: no adenopathy, supple, symmetrical, trachea midline and thyroid normal to inspection and palpation Lungs: clear to auscultation bilaterally Breasts: consistent with bilateral reduction,  3 cm right lateral mass, no left breast mass, No  nipple retraction or dimpling, No nipple discharge or bleeding, No axillary adenopathy Heart: regular rate and rhythm Abdomen: soft, non-tender; no masses, no organomegaly Extremities: extremities normal, atraumatic, no cyanosis or edema Skin: skin color, texture, turgor normal. No rashes or lesions Lymph nodes: cervical, supraclavicular, and axillary nodes normal. Neurologic: grossly normal  Pelvic: External genitalia:  no lesions              No abnormal inguinal nodes palpated.  Urethra:  normal appearing urethra with no masses, tenderness or lesions              Bartholins and Skenes: normal                 Vagina: normal appearing vagina with normal color and discharge, no lesions              Cervix: no lesions              Pap taken: No. Bimanual Exam:  Uterus:  normal size, contour, position, consistency, mobility, non-tender              Adnexa: no mass, fullness, tenderness              Rectal exam: Yes.  .  Confirms.              Anus:  normal sphincter tone, no lesions  Chaperone was present for exam:  Antony Salmon, CMA  ASSESSMENT: Well woman visit with gynecologic exam Hx LGSIL.  HSV 2.  Mixed incontinence.  Bilateral breast reduction.  Rght breast lump.  PCP has ordered diagnostic testing.  Postcoital bleeding has resolved.  Using Vagifem.  STD screening.  PHQ2:  0  PLAN: Mammogram discussed.  Patient already had a dx mammogram and [place] ordered through her PCP.  Self breast awareness reviewed. Pap and HRV collected:  no.  This will be due in October, 2025.  Guidelines for Calcium, Vitamin D, regular exercise program including cardiovascular and weight bearing exercise. Medication refills:  Refill of Vagifem for 3 months while waiting for dx breast imaging. Refill of Valtrex to take for suppression.   STD screening.  Follow up:  for pap and HR HPV testing in October and then annual exam in one year.

## 2023-07-06 ENCOUNTER — Ambulatory Visit: Payer: 59 | Admitting: Obstetrics

## 2023-07-06 VITALS — BP 129/75 | HR 75

## 2023-07-06 DIAGNOSIS — N950 Postmenopausal bleeding: Secondary | ICD-10-CM | POA: Diagnosis not present

## 2023-07-06 DIAGNOSIS — N8110 Cystocele, unspecified: Secondary | ICD-10-CM | POA: Diagnosis not present

## 2023-07-06 DIAGNOSIS — N3946 Mixed incontinence: Secondary | ICD-10-CM

## 2023-07-06 DIAGNOSIS — K5900 Constipation, unspecified: Secondary | ICD-10-CM

## 2023-07-06 MED ORDER — MIRABEGRON ER 25 MG PO TB24: 25.0000 mg | ORAL_TABLET | Freq: Every day | ORAL | 0 refills | Status: AC

## 2023-07-06 NOTE — Assessment & Plan Note (Signed)
-   known CIN I on colposcopy 02/20/23 - hysteroscopy D&C 02/22/21 with benign polyp - postcoital bleeding in 2024 when she resumed sexual activity - discussed likely due to atrophy since it improved with vaginal estrogen, however cannot rule out endometrial pathology - TVUS ordered, provided pt with number to call or can be completed at Dr. Rica Records office, discussed need to repeat EMB or D&C if persistent symptoms despite normal TVUS findings. - pending appt 07/09/23, advised pt to present to appt and continue workup

## 2023-07-06 NOTE — Assessment & Plan Note (Addendum)
-   stress > urgency symptoms - 05/25/23 POCT UA negative, bladder scan WNL at 15mL - For treatment of stress urinary incontinence,  non-surgical options include expectant management, weight loss, physical therapy, as well as a pessary.  Surgical options include a midurethral sling, Burch urethropexy, and transurethral injection of a bulking agent. We discussed the symptoms of overactive bladder (OAB), which include urinary urgency, urinary frequency, nocturia, with or without urge incontinence.  While we do not know the exact etiology of OAB, several treatment options exist. We discussed management including behavioral therapy (decreasing bladder irritants, urge suppression strategies, timed voids, bladder retraining), physical therapy, medication; for refractory cases posterior tibial nerve stimulation, sacral neuromodulation, and intravesical botulinum toxin injection.  For anticholinergic medications, we discussed the potential side effects of anticholinergics including dry eyes, dry mouth, constipation, cognitive impairment and urinary retention. For Beta-3 agonist medication, we discussed the potential side effect of elevated blood pressure which is more likely to occur in individuals with uncontrolled hypertension. - samples and Rx for trial of gemtesa if refractory symptoms after Kegels and continue treatment of constipation - Rx mirabegron to assess cost since Gemtesa is cost-prohibitive, advised BP monitoring if she starts mirabegron and call office to report results prior to continuing medication - encouraged fluid management

## 2023-07-06 NOTE — Assessment & Plan Note (Signed)
-   For constipation, we reviewed the importance of a better bowel regimen and association with pelvic floor symptoms.  We also discussed the importance of avoiding chronic straining, as it can exacerbate her pelvic floor symptoms; we discussed treating constipation and straining prior to surgery, as postoperative straining can lead to damage to the repair and recurrence of symptoms. We discussed initiating therapy with increasing fluid intake, fiber supplementation, stool softeners, and laxatives such as miralax.  - encouraged squatting position and relaxation during bowel movements  - encouraged to consider pelvic floor PT - continue benefiber and mylanta with improvement of daily bowel movements and reduction of urinary leakage.  - recommended miralax daily, fiber supplementation, avoid diary/artifical sweeteners and trial of low FODMAP diet for IBS per Dr. Lorenso Quarry (GI)

## 2023-07-06 NOTE — Assessment & Plan Note (Signed)
-   For treatment of pelvic organ prolapse, we discussed options for management including expectant management, conservative management, and surgical management, such as Kegels, a pessary, pelvic floor physical therapy, and specific surgical procedures. 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  - desires to postpone workup or intervention at this time due to stressors. Encouraged pt to call if she desires to return for follow-up prior to 6 months.

## 2023-07-06 NOTE — Progress Notes (Signed)
[facility] Return Visit  SUBJECTIVE  History of Present Illness: Anna Chavez is a 63 y.o. female seen in follow-up for stage II pelvic organ prolapse, mixed urinary incontinence, constipation, fecal incontinence, postmenopausal bleeding. Plan at last visit was trial of Gemtesa, fiber supplementation, referral to establish care with GYN.   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 Considering disability and consulting attorney  Feeling overwhelmed and desires to postpone additional treatment or workup at this time  Most bothered by gas, started fiber supplementation with benefiber and mylanta with improvement of daily bowel movements and reduction of urinary leakage.  Per chart review, recommended miralax daily, fiber supplementation, avoid diary/artifical sweeteners and trial of low FODMAP diet for IBS per Dr. Lorenso Chavez (GI) Leak 2-3x/week down from 2-3x/day Continues vaginal estrogen 2x week Did not start Gemtesa since Rx costs >$1000.  Declines pelvic floor PT Fiber supplementation TVUS ordered Pending visit with Dr. Edward Chavez 07/09/23 for PMB, last spotting noted fall of 2024.   Past Medical History: Patient  has a past medical history of CIN I (cervical intraepithelial neoplasia I), Depression, Endometrial polyp, Fibromyalgia, GAD (generalized anxiety disorder), GERD (gastroesophageal reflux disease), Herpes simplex type 2 infection (2020), History of abnormal cervical Pap smear, History of benign ovarian tumor, History of COVID-19 (05/12/2019), History of infertility, female, Hyperlipidemia, Lactose intolerance, Multiple thyroid nodules, Ovarian hyperstimulation syndrome, PCOS (polycystic ovarian syndrome), PMB (postmenopausal bleeding), SUI (stress urinary  incontinence, female), and Wears glasses.   Past Surgical History: She  has a past surgical history that includes Breast reduction surgery (Bilateral, 2004); Colposcopy; Dilation and curettage of uterus; Fertility Surgery; Dilatation & curettage/hysteroscopy with myosure (N/A, 02/22/2021); ACDF  (06/01/2022); and Abdominoplasty.   Medications: She has a current medication list which includes the following prescription(s): mirabegron er, alprazolam, buspirone, duloxetine, gabapentin, multivitamin, valacyclovir, gemtesa, and gemtesa.   Allergies: Patient has no known allergies.   Social History: Patient  reports that she has never smoked. She has never used smokeless tobacco. She reports current alcohol use. She reports that she does not use drugs.     OBJECTIVE     Physical Exam: Vitals:   07/06/23 1501  BP: 135/75  Pulse: (!) 105   Gen: No apparent distress, A&O x 3.  Detailed Urogynecologic Evaluation:  Deferred.      ASSESSMENT AND PLAN    Anna Chavez is a 63 y.o. with:  1. Mixed stress and urge urinary incontinence   2. Constipation, unspecified constipation type   3. Pelvic organ prolapse quantification stage 2 cystocele   4. Postmenopausal bleeding     Mixed stress and urge urinary incontinence Assessment & Plan: - stress > urgency symptoms - 05/25/23 POCT UA negative, bladder scan WNL at 15mL - For treatment of stress urinary incontinence,  non-surgical options include expectant management, weight loss, physical therapy, as well as a pessary.  Surgical options include a midurethral sling, Burch urethropexy, and transurethral injection of a bulking agent. We discussed the symptoms of overactive bladder (OAB), which include urinary urgency, urinary frequency, nocturia, with or without urge incontinence.  While we do not know the exact etiology of OAB, several treatment options exist. We discussed management including behavioral therapy (decreasing bladder irritants, urge  suppression strategies, timed voids, bladder retraining), physical therapy, medication; for refractory cases posterior tibial  nerve stimulation, sacral neuromodulation, and intravesical botulinum toxin injection.  For anticholinergic medications, we discussed the potential side effects of anticholinergics including dry eyes, dry mouth, constipation, cognitive impairment and urinary retention. For Beta-3 agonist medication, we discussed the potential side effect of elevated blood pressure which is more likely to occur in individuals with uncontrolled hypertension. - samples and Rx for trial of gemtesa if refractory symptoms after Kegels and continue treatment of constipation - Rx mirabegron to assess cost since Gemtesa is cost-prohibitive, advised BP monitoring if she starts mirabegron and call office to report results prior to continuing medication - encouraged fluid management   Orders: -     Mirabegron ER; Take 1 tablet (25 mg total) by mouth daily.  Dispense: 30 tablet; Refill: 0  Constipation, unspecified constipation type Assessment & Plan: - For constipation, we reviewed the importance of a better bowel regimen and association with pelvic floor symptoms.  We also discussed the importance of avoiding chronic straining, as it can exacerbate her pelvic floor symptoms; we discussed treating constipation and straining prior to surgery, as postoperative straining can lead to damage to the repair and recurrence of symptoms. We discussed initiating therapy with increasing fluid intake, fiber supplementation, stool softeners, and laxatives such as miralax.  - encouraged squatting position and relaxation during bowel movements  - encouraged to consider pelvic floor PT - continue benefiber and mylanta with improvement of daily bowel movements and reduction of urinary leakage.  - recommended miralax daily, fiber supplementation, avoid diary/artifical sweeteners and trial of low FODMAP diet for IBS per Dr.  Lorenso Chavez (GI)   Pelvic organ prolapse quantification stage 2 cystocele Assessment & Plan: - For treatment of pelvic organ prolapse, we discussed options for management including expectant management, conservative management, and surgical management, such as Kegels, a pessary, pelvic floor physical therapy, and specific surgical procedures. 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  - desires to postpone workup or intervention at this time due to stressors. Encouraged pt to call if she desires to return for follow-up prior to 6 months.   Postmenopausal bleeding Assessment & Plan: - known CIN I on colposcopy 02/20/23 - hysteroscopy D&C 02/22/21 with benign polyp - postcoital bleeding in 2024 when she resumed sexual activity - discussed likely due to atrophy since it improved with vaginal estrogen, however cannot rule out endometrial pathology - TVUS ordered, provided pt with number to call or can be completed at Dr. Rica Records office, discussed need to repeat EMB or D&C if persistent symptoms despite normal TVUS findings. - pending appt 07/09/23, advised pt to present to appt and continue workup    Time spent: I spent 33 minutes dedicated to the care of this patient on the date of this encounter to include pre-visit review of records, face-to-face time with the patient discussing mixed urinary incontinence, stage II pelvic organ prolapse, PMB,  constipation, and post visit documentation.   Loleta Chance, MD

## 2023-07-06 NOTE — Patient Instructions (Addendum)
Please call radiology at [phone] to schedule your imaging study today. Please follow-up with Dr. Edward Jolly.   Consider starting gemtesa 1 tab daily for overactive bladder. Call your pharmacy to assess cost of mirabegron. If you experience relief from [facility], start mirabegron after completion of samples.   For Beta-3 agonist medication,there is a potential side effect of elevated blood pressure which is more likely to occur in individuals with uncontrolled hypertension. It appears that your most recent blood pressure is within normal limits 130s/70s. Please monitor your blood pressure and stop the medication if you experience any headache, chest discomfort, or shortness of breath and seek care immediately.  I have sent your prescription of mirabegron to your pharmacy. Start at 25mg  daily for 1 month, if your blood pressure remains unchanged, increase to 50mg  after 1 month and continue to monitor your blood pressure.  Start Kegel exercises   Continue titration of benefiber for stool consistency due to improvement in your urinary leakage symptoms.   Continue vaginal estrogen 1g twice a week.

## 2023-07-07 ENCOUNTER — Other Ambulatory Visit: Payer: Self-pay | Admitting: Physician Assistant

## 2023-07-07 DIAGNOSIS — N6313 Unspecified lump in the right breast, lower outer quadrant: Secondary | ICD-10-CM

## 2023-07-09 ENCOUNTER — Other Ambulatory Visit (HOSPITAL_COMMUNITY)
Admission: RE | Admit: 2023-07-09 | Discharge: 2023-07-09 | Disposition: A | Discharge status: Home / Self Care | Payer: 59 | Source: Ambulatory Visit | Attending: Obstetrics and Gynecology | Admitting: Obstetrics and Gynecology

## 2023-07-09 ENCOUNTER — Encounter: Payer: Self-pay | Admitting: Obstetrics and Gynecology

## 2023-07-09 ENCOUNTER — Ambulatory Visit (INDEPENDENT_AMBULATORY_CARE_PROVIDER_SITE_OTHER): Payer: 59 | Admitting: Obstetrics and Gynecology

## 2023-07-09 VITALS — BP 122/68 | HR 89 | Ht 64.0 in | Wt 186.0 lb

## 2023-07-09 DIAGNOSIS — Z113 Encounter for screening for infections with a predominantly sexual mode of transmission: Secondary | ICD-10-CM

## 2023-07-09 DIAGNOSIS — Z1331 Encounter for screening for depression: Secondary | ICD-10-CM | POA: Diagnosis not present

## 2023-07-09 DIAGNOSIS — Z01419 Encounter for gynecological examination (general) (routine) without abnormal findings: Secondary | ICD-10-CM

## 2023-07-09 DIAGNOSIS — Z1159 Encounter for screening for other viral diseases: Secondary | ICD-10-CM

## 2023-07-09 DIAGNOSIS — B009 Herpesviral infection, unspecified: Secondary | ICD-10-CM

## 2023-07-09 DIAGNOSIS — Z114 Encounter for screening for human immunodeficiency virus [HIV]: Secondary | ICD-10-CM

## 2023-07-09 MED ORDER — VALACYCLOVIR HCL 500 MG PO TABS: 500.0000 mg | ORAL_TABLET | Freq: Every day | ORAL | 3 refills | Status: AC

## 2023-07-09 MED ORDER — ESTRADIOL 10 MCG VA TABS: 10.0000 ug | ORAL_TABLET | VAGINAL | 0 refills | Status: DC

## 2023-07-09 NOTE — Patient Instructions (Signed)

## 2023-07-10 ENCOUNTER — Encounter: Payer: Self-pay | Admitting: Obstetrics and Gynecology

## 2023-07-10 LAB — CERVICOVAGINAL ANCILLARY ONLY
Chlamydia: NEGATIVE
Comment: NEGATIVE
Comment: NEGATIVE
Comment: NORMAL
Neisseria Gonorrhea: NEGATIVE
Trichomonas: NEGATIVE

## 2023-07-10 LAB — HEPATITIS C ANTIBODY: Hepatitis C Ab: NONREACTIVE

## 2023-07-10 LAB — HIV ANTIBODY (ROUTINE TESTING W REFLEX): HIV 1&2 Ab, 4th Generation: NONREACTIVE

## 2023-07-10 LAB — SYPHILIS: RPR W/REFLEX TO RPR TITER AND TREPONEMAL ANTIBODIES, TRADITIONAL SCREENING AND DIAGNOSIS ALGORITHM: RPR Ser Ql: NONREACTIVE

## 2023-07-11 ENCOUNTER — Encounter: Payer: Self-pay | Admitting: Physician Assistant

## 2023-07-13 ENCOUNTER — Ambulatory Visit (HOSPITAL_BASED_OUTPATIENT_CLINIC_OR_DEPARTMENT_OTHER): Payer: 59

## 2023-07-20 ENCOUNTER — Ambulatory Visit
Admission: RE | Admit: 2023-07-20 | Discharge: 2023-07-20 | Disposition: A | Discharge status: Home / Self Care | Payer: 59 | Source: Ambulatory Visit | Attending: Physician Assistant | Admitting: Physician Assistant

## 2023-07-20 DIAGNOSIS — N6313 Unspecified lump in the right breast, lower outer quadrant: Secondary | ICD-10-CM
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
## 2023-08-03 ENCOUNTER — Other Ambulatory Visit

## 2023-08-03 ENCOUNTER — Ambulatory Visit: Payer: 59

## 2023-08-03 ENCOUNTER — Ambulatory Visit (INDEPENDENT_AMBULATORY_CARE_PROVIDER_SITE_OTHER): Payer: 59 | Admitting: Physician Assistant

## 2023-08-03 ENCOUNTER — Other Ambulatory Visit: Payer: Self-pay | Admitting: Rehabilitation

## 2023-08-03 ENCOUNTER — Encounter: Payer: Self-pay | Admitting: Physician Assistant

## 2023-08-03 VITALS — HR 65 | Resp 20 | Ht 64.0 in | Wt 188.0 lb

## 2023-08-03 DIAGNOSIS — M4807 Spinal stenosis, lumbosacral region: Secondary | ICD-10-CM

## 2023-08-03 DIAGNOSIS — R413 Other amnesia: Secondary | ICD-10-CM

## 2023-08-03 NOTE — Patient Instructions (Addendum)
 It was a pleasure to see you today at our office.   Recommendations:   Recomennd neuropsychological evaluation  MRI of the brain, the radiology office will call you to arrange you appointment   Check labs today  suite  Resume psychotherapy  Recommend sleep studies  Recommend resuming care with rheumathology for fibrmyalgia Follow up in 3  months Recommend visiting the website : " Dementia Success Path" to better understand some behaviors related to memory loss.    For psychiatric meds, mood meds: Please have your primary care physician manage these medications.  If you have any severe symptoms of a stroke, or other severe issues such as confusion,severe chills or fever, etc call 911 or go to the ER as you may need to be evaluated further   For guidance regarding [facility] and if placement were needed at the facility, contact Social Worker tel: [phone]  For assessment of decision of mental capacity and competency:  Call Dr. Erick Blinks, geriatric psychiatrist at [phone]  Counseling regarding caregiver distress, including caregiver depression, anxiety and issues regarding community resources, adult day care programs, adult living facilities, or memory care questions:  please contact your  Primary Doctor's Social Worker   Whom to call: Memory  decline, memory medications: Call our office [phone]    [email or web address]   RECOMMENDATIONS FOR ALL PATIENTS WITH MEMORY PROBLEMS: 1. Continue to exercise (Recommend 30 minutes of walking everyday, or 3 hours every week) 2. Increase social interactions - continue going to [place] and enjoy social gatherings with friends and family 3. Eat healthy, avoid fried foods and eat more fruits and vegetables 4. Maintain adequate blood pressure, blood sugar, and blood cholesterol level. Reducing the risk of stroke and cardiovascular disease also helps promoting  better memory. 5. Avoid stressful situations. Live a simple life and avoid aggravations. Organize your time and prepare for the next day in anticipation. 6. Sleep well, avoid any interruptions of sleep and avoid any distractions in the bedroom that may interfere with adequate sleep quality 7. Avoid sugar, avoid sweets as there is a strong link between excessive sugar intake, diabetes, and cognitive impairment We discussed the Mediterranean diet, which has been shown to help patients reduce the risk of progressive memory disorders and reduces cardiovascular risk. This includes eating fish, eat fruits and green leafy vegetables, nuts like almonds and hazelnuts, walnuts, and also use olive oil. Avoid fast foods and fried foods as much as possible. Avoid sweets and sugar as sugar use has been linked to worsening of memory function.  There is always a concern of gradual progression of memory problems. If this is the case, then we may need to adjust level of care according to patient needs. Support, both to the patient and caregiver, should then be put into place.  -     DRIVING: Regarding driving, in patients with progressive memory problems, driving will be impaired. We advise to have someone else do the driving if trouble finding directions or if minor accidents are reported. Independent driving assessment is available to determine safety of driving.   If you are interested in the driving assessment, you can contact the following:  [employer] in [place] [phone]  [facility] [phone]  [facility] [phone]  [facility] [phone] or [phone]   FALL PRECAUTIONS: Be cautious when walking. Scan the area for obstacles that may increase the risk of trips and falls. When getting up in the mornings, sit up at the edge  of the bed for a few minutes before getting out of bed. Consider elevating the bed at the head end to avoid drop of  blood pressure when getting up. Walk always in a well-lit room (use night lights in the walls). Avoid area rugs or power cords from appliances in the middle of the walkways. Use a walker or a cane if necessary and consider physical therapy for balance exercise. Get your eyesight checked regularly.  FINANCIAL OVERSIGHT: Supervision, especially oversight when making financial decisions or transactions is also recommended.  HOME SAFETY: Consider the safety of the kitchen when operating appliances like stoves, microwave oven, and blender. Consider having supervision and share cooking responsibilities until no longer able to participate in those. Accidents with firearms and other hazards in the house should be identified and addressed as well.   ABILITY TO BE LEFT ALONE: If patient is unable to contact 911 operator, consider using LifeLine, or when the need is there, arrange for someone to stay with patients. Smoking is a fire hazard, consider supervision or cessation. Risk of wandering should be assessed by caregiver and if detected at any point, supervision and safe proof recommendations should be instituted.  MEDICATION SUPERVISION: Inability to self-administer medication needs to be constantly addressed. Implement a mechanism to ensure safe administration of the medications.      Mediterranean Diet A Mediterranean diet refers to food and lifestyle choices that are based on the traditions of countries located on the [employer]. This way of eating has been shown to help prevent certain conditions and improve outcomes for people who have chronic diseases, like kidney disease and heart disease. What are tips for following this plan? Lifestyle  Cook and eat meals together with your family, when possible. Drink enough fluid to keep your urine clear or pale yellow. Be physically active every day. This includes: Aerobic exercise like running or swimming. Leisure activities like gardening,  walking, or housework. Get 7-8 hours of sleep each night. If recommended by your health care provider, drink red wine in moderation. This means 1 glass a day for nonpregnant women and 2 glasses a day for men. A glass of wine equals 5 oz (150 mL). Reading food labels  Check the serving size of packaged foods. For foods such as rice and pasta, the serving size refers to the amount of cooked product, not dry. Check the total fat in packaged foods. Avoid foods that have saturated fat or trans fats. Check the ingredients list for added sugars, such as corn syrup. Shopping  At the grocery store, buy most of your food from the areas near the walls of the store. This includes: Fresh fruits and vegetables (produce). Grains, beans, nuts, and seeds. Some of these may be available in unpackaged forms or large amounts (in bulk). Fresh seafood. Poultry and eggs. Low-fat dairy products. Buy whole ingredients instead of prepackaged foods. Buy fresh fruits and vegetables in-season from local farmers markets. Buy frozen fruits and vegetables in resealable bags. If you do not have access to quality fresh seafood, buy precooked frozen shrimp or canned fish, such as tuna, salmon, or sardines. Buy small amounts of raw or cooked vegetables, salads, or olives from the deli or salad bar at your store. Stock your pantry so you always have certain foods on hand, such as olive oil, canned tuna, canned tomatoes, rice, pasta, and beans. Cooking  Cook foods with extra-virgin olive oil instead of using butter or other vegetable oils. Have meat as a side dish, and  have vegetables or grains as your main dish. This means having meat in small portions or adding small amounts of meat to foods like pasta or stew. Use beans or vegetables instead of meat in common dishes like chili or lasagna. Experiment with different cooking methods. Try roasting or broiling vegetables instead of steaming or sauteing them. Add frozen vegetables  to soups, stews, pasta, or rice. Add nuts or seeds for added healthy fat at each meal. You can add these to yogurt, salads, or vegetable dishes. Marinate fish or vegetables using olive oil, lemon juice, garlic, and fresh herbs. Meal planning  Plan to eat 1 vegetarian meal one day each week. Try to work up to 2 vegetarian meals, if possible. Eat seafood 2 or more times a week. Have healthy snacks readily available, such as: Vegetable sticks with hummus. Greek yogurt. Fruit and nut trail mix. Eat balanced meals throughout the week. This includes: Fruit: 2-3 servings a day Vegetables: 4-5 servings a day Low-fat dairy: 2 servings a day Fish, poultry, or lean meat: 1 serving a day Beans and legumes: 2 or more servings a week Nuts and seeds: 1-2 servings a day Whole grains: 6-8 servings a day Extra-virgin olive oil: 3-4 servings a day Limit red meat and sweets to only a few servings a month What are my food choices? Mediterranean diet Recommended Grains: Whole-grain pasta. Brown rice. Bulgar wheat. Polenta. Couscous. Whole-wheat bread. Orpah Cobb. Vegetables: Artichokes. Beets. Broccoli. Cabbage. Carrots. Eggplant. Green beans. Chard. Kale. Spinach. Onions. Leeks. Peas. Squash. Tomatoes. Peppers. Radishes. Fruits: Apples. Apricots. Avocado. Berries. Bananas. Cherries. Dates. Figs. Grapes. Lemons. Melon. Oranges. Peaches. Plums. Pomegranate. Meats and other protein foods: Beans. Almonds. Sunflower seeds. Pine nuts. Peanuts. Cod. Salmon. Scallops. Shrimp. Tuna. Tilapia. Clams. Oysters. Eggs. Dairy: Low-fat milk. Cheese. Greek yogurt. Beverages: Water. Red wine. Herbal tea. Fats and oils: Extra virgin olive oil. Avocado oil. Grape seed oil. Sweets and desserts: [place] yogurt with honey. Baked apples. Poached pears. Trail mix. Seasoning and other foods: Basil. Cilantro. Coriander. Cumin. Mint. Parsley. Sage. Rosemary. Tarragon. Garlic. Oregano. Thyme. Pepper. Balsalmic vinegar. Tahini.  Hummus. Tomato sauce. Olives. Mushrooms. Limit these Grains: Prepackaged pasta or rice dishes. Prepackaged cereal with added sugar. Vegetables: Deep fried potatoes (french fries). Fruits: Fruit canned in syrup. Meats and other protein foods: Beef. Pork. Lamb. Poultry with skin. Hot dogs. Tomasa Blase. Dairy: Ice cream. Sour cream. Whole milk. Beverages: Juice. Sugar-sweetened soft drinks. Beer. Liquor and spirits. Fats and oils: Butter. Canola oil. Vegetable oil. Beef fat (tallow). Lard. Sweets and desserts: Cookies. Cakes. Pies. Candy. Seasoning and other foods: Mayonnaise. Premade sauces and marinades. The items listed may not be a complete list. Talk with your dietitian about what dietary choices are right for you. Summary The Mediterranean diet includes both food and lifestyle choices. Eat a variety of fresh fruits and vegetables, beans, nuts, seeds, and whole grains. Limit the amount of red meat and sweets that you eat. Talk with your health care provider about whether it is safe for you to drink red wine in moderation. This means 1 glass a day for nonpregnant women and 2 glasses a day for men. A glass of wine equals 5 oz (150 mL). This information is not intended to replace advice given to you by your health care provider. Make sure you discuss any questions you have with your health care provider. Document Released: 12/23/2015 Document Revised: 01/25/2016 Document Reviewed: 12/23/2015 Elsevier Interactive Patient Education  2017 [employer].

## 2023-08-04 LAB — TSH: TSH: 1.49 m[IU]/L (ref 0.40–4.50)

## 2023-08-04 LAB — VITAMIN B12: Vitamin B-12: 591 pg/mL (ref 200–1100)

## 2023-08-13 ENCOUNTER — Ambulatory Visit: Payer: Self-pay | Admitting: Surgery

## 2023-08-13 DIAGNOSIS — N6311 Unspecified lump in the right breast, upper outer quadrant: Secondary | ICD-10-CM

## 2023-08-18 ENCOUNTER — Ambulatory Visit
Admission: RE | Admit: 2023-08-18 | Discharge: 2023-08-18 | Disposition: A | Discharge status: Home / Self Care | Source: Ambulatory Visit | Attending: Rehabilitation | Admitting: Rehabilitation

## 2023-08-18 ENCOUNTER — Other Ambulatory Visit

## 2023-08-18 DIAGNOSIS — M4807 Spinal stenosis, lumbosacral region: Secondary | ICD-10-CM

## 2023-08-19 ENCOUNTER — Other Ambulatory Visit: Payer: Self-pay | Admitting: Obstetrics and Gynecology

## 2023-08-20 ENCOUNTER — Telehealth: Payer: Self-pay

## 2023-08-20 NOTE — Telephone Encounter (Signed)
 Patient cancelled MRI of brain wo contrast at [facility]. She is going to reschedule. FYI

## 2023-08-20 NOTE — Telephone Encounter (Signed)
 Med refill request: [facility] Last AEX: 07/09/23 Next OV: 03/10/24 Last MMG (if hormonal med) 07/20/23 Refill authorized: Please Advise, #[id number], 1RF

## 2023-08-21 ENCOUNTER — Other Ambulatory Visit: Payer: Self-pay | Admitting: Surgery

## 2023-08-21 DIAGNOSIS — N6311 Unspecified lump in the right breast, upper outer quadrant: Secondary | ICD-10-CM

## 2023-08-29 ENCOUNTER — Other Ambulatory Visit: Payer: Self-pay

## 2023-08-29 ENCOUNTER — Encounter (HOSPITAL_BASED_OUTPATIENT_CLINIC_OR_DEPARTMENT_OTHER): Payer: Self-pay | Admitting: Surgery

## 2023-08-31 ENCOUNTER — Encounter: Payer: Self-pay | Admitting: Rehabilitation

## 2023-09-03 MED ORDER — CHLORHEXIDINE GLUCONATE CLOTH 2 % EX PADS
6.0000 | MEDICATED_PAD | Freq: Once | CUTANEOUS | Status: DC
Start: 2023-09-03 — End: 2023-09-06

## 2023-09-03 MED ORDER — CHLORHEXIDINE GLUCONATE CLOTH 2 % EX PADS: 6.0000 | MEDICATED_PAD | Freq: Once | CUTANEOUS | Status: DC

## 2023-09-03 NOTE — Progress Notes (Signed)

## 2023-09-05 ENCOUNTER — Ambulatory Visit: Payer: Self-pay

## 2023-09-05 ENCOUNTER — Institutional Professional Consult (permissible substitution): Admitting: Psychology

## 2023-09-05 ENCOUNTER — Other Ambulatory Visit: Payer: Self-pay | Admitting: Surgery

## 2023-09-05 ENCOUNTER — Ambulatory Visit
Admission: RE | Admit: 2023-09-05 | Discharge: 2023-09-05 | Disposition: A | Discharge status: Home / Self Care | Source: Ambulatory Visit | Attending: Surgery | Admitting: Surgery

## 2023-09-05 DIAGNOSIS — N6311 Unspecified lump in the right breast, upper outer quadrant: Secondary | ICD-10-CM

## 2023-09-05 HISTORY — PX: BREAST BIOPSY: SHX20

## 2023-09-05 NOTE — H&P (Signed)
 History of Present Illness: Anna Chavez is a 63 y.o. female who is seen today as an office consultation for evaluation of No chief complaint on file. Patient presents for evaluation of abnormal mammogram. 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 On recent imaging and an increase in size from 2.4 cm to 3.7 cm. Due to increasing size, excision recommended. Patient has a history of a left breast fibroadenoma which was not seen on this year's imaging.   Review of Systems: A complete review of systems was obtained from the patient. I have reviewed this information and discussed as appropriate with the patient. See HPI as well for other ROS.    Medical History: Past Medical History:  Diagnosis Date  Anxiety  Breast mass 2023  right breast  Chronic headaches  Depression  Easy bruising  Fibromyalgia  IBS (irritable bowel syndrome)  Thyroid  disease  local -biopsy   Patient Active Problem List  Diagnosis  Anxiety  Chronic headaches  Depression  Easy bruising  Fibromyalgia  IBS (irritable bowel syndrome)  Thyroid  disease   Past Surgical History:  Procedure Laterality Date  COMBINED REDUCTION MAMMAPLASTY W/ ABDOMINOPLASTY 2004  Endometrial polyp excision 2022  ARTHRODESIS ANTERIOR CERVICLE SPINE N/A 06/01/2022  Procedure: Anterior Cervical Discectomy and fusion C 4-5, 5-6, 6-7, C7-T1 with Medtronic Cornerstone LS Allograft and Medtronic Atlantis plating; Surgeon: [employer], MD; Location: [facility] OR; Service: Neurosurgery; Laterality: N/A;  INSTRUMENTATION ANTERIOR SPINE 8/MORE SEGMENTS N/A 06/01/2022  Procedure: Additional levels; Surgeon: [employer], MD; Location: [facility] OR; Service: Neurosurgery; Laterality: N/A;  INSERTION STRUCTURAL BONE ALLOGRAFT FOR SPINE SURGERY N/A 06/01/2022  Procedure: INSERTION STRUCTURAL BONE ALLOGRAFT FOR SPINE SURGERY [phone]; Surgeon: [employer], MD; Location: [facility] OR; Service: Neurosurgery;  Laterality: N/A;  ARTHRODESIS ANTERIOR CERVICLE SPINE N/A 06/01/2022  Procedure: ARTHRODESIS ANT INTERBODY INC DISCECTOMY, CERVICAL BELOW C2 EACH ADDL; Surgeon: [employer], MD; Location: [facility] OR; Service: Neurosurgery; Laterality: N/A;  Excision of elbow cyst Left  infertility surgeries  with biopsies    No Known Allergies  Current Outpatient Medications on File Prior to Visit  Medication Sig Dispense Refill  acetaminophen  (TYLENOL ) 325 MG tablet 325 mg  busPIRone (BUSPAR) 15 MG tablet Take 15 mg by mouth every morning  celecoxib  (CELEBREX ) 200 MG capsule Take 1 capsule (200 mg total) by mouth 2 (two) times daily MAY RESTART ONE WEEK AFTER SURGERY. ASK THE PRESCRIBER TO TRY TO WEAN THIS OFF OR A LEAST LOWER DOSE (Patient not taking: Reported on 08/02/2023)  DULoxetine (CYMBALTA) 20 MG DR capsule Take 40 mg by mouth every morning  gabapentin  (NEURONTIN ) 100 MG capsule Take 3 capsules by mouth at bedtime  methocarbamoL (ROBAXIN) 500 MG tablet Take 1 tablet (500 mg total) by mouth 3 (three) times daily as needed 90 tablet 2  multivit-iron-FA-calcium-mins (WOMEN'S ONE DAILY) 18 mg iron-400 mcg-500 mg Ca Tab as directed Orally  valACYclovir  (VALTREX ) 500 MG tablet Take 500 mg by mouth at bedtime   No current facility-administered medications on file prior to visit.   Family History  Problem Relation Age of Onset  Diabetes type II Mother  Heart disease Mother  Pancreatic cancer Mother  Heart disease Father  Myocardial Infarction (Heart attack) Father    Social History   Tobacco Use  Smoking Status Never  Passive exposure: Past (Parents smoked in the house.)  Smokeless Tobacco Never    Social History   Socioeconomic History  Marital status: Divorced  Occupational History  Occupation:  Court Reporter  Comment: not able to work due to symptoms out on FMLA as of 03/06/22  Tobacco Use  Smoking status: Never  Passive exposure: Past (Parents smoked in the house.)  Smokeless  tobacco: Never  Vaping Use  Vaping status: Never Used  Substance and Sexual Activity  Alcohol use: Yes  Alcohol/week: 1.0 - 2.0 standard drink of alcohol  Types: 1 - 2 Glasses of wine per week  Drug use: Never   Social Drivers of Health   Financial Resource Strain: Medium Risk (08/02/2023)  Overall Financial Resource Strain (CARDIA)  Difficulty of Paying Living Expenses: Somewhat hard  Food Insecurity: No Food Insecurity (08/02/2023)  Hunger Vital Sign  Worried About Running Out of Food in the Last Year: Never true  Ran Out of Food in the Last Year: Never true  Transportation Needs: No Transportation Needs (08/02/2023)  PRAPARE - Risk analyst (Medical): No  Lack of Transportation (Non-Medical): No  Housing Stability: Low Risk (08/02/2023)  Housing Stability Vital Sign  Unable to Pay for Housing in the Last Year: No  Number of Times Moved in the Last Year: 0  Homeless in the Last Year: No   Objective:  There were no vitals filed for this visit.  There is no height or weight on file to calculate BMI.  Physical Exam Exam conducted with a chaperone present.  HENT:  Head: Normocephalic.  Cardiovascular:  Rate and Rhythm: Normal rate.  Pulmonary:  Effort: Pulmonary effort is normal.  Chest:  Breasts: Right: Mass present.  Left: Normal. No mass.   Comments: 3 cm mass  Lymphadenopathy:  Upper Body:  Right upper body: No supraclavicular or axillary adenopathy.  Left upper body: No supraclavicular or axillary adenopathy.  Skin: General: Skin is warm.  Neurological:  General: No focal deficit present.  Mental Status: She is alert.  Psychiatric:  Mood and Affect: Mood normal.     Labs, Imaging and Diagnostic Testing:  CLINICAL DATA: 89-year-old female presenting for evaluation of a new  lump in the outer right breast. Patient is also due for annual exam  of the left breast.   EXAM:  DIGITAL DIAGNOSTIC BILATERAL MAMMOGRAM WITH TOMOSYNTHESIS  AND CAD;  ULTRASOUND RIGHT BREAST LIMITED   TECHNIQUE:  Bilateral digital diagnostic mammography and breast tomosynthesis  was performed. The images were evaluated with computer-aided  detection. ; Targeted ultrasound examination of the right breast was  performed   COMPARISON: Previous exam(s).   ACR Breast Density Category b: There are scattered areas of  fibroglandular density.   FINDINGS:  Mammogram:   Right breast: A skin BB marks the palpable site of concern reported  by the patient in the outer right breast. This appears to correspond  to a in oval circumscribed mass that was previously biopsied  demonstrating fibroadenoma which has enlarged compared to the prior  mammogram. There are no new findings elsewhere in the right breast.   Left breast: No suspicious mass, distortion, or microcalcifications  are identified to suggest presence of malignancy.   On physical exam at the site of concern reported by the patient in  the outer right breast I feel a discrete mass.   Ultrasound:   Targeted ultrasound performed at the site of concern in the right  breast at 9 o'clock 10 cm from the nipple demonstrating an oval  circumscribed hypoechoic mass containing a biopsy marking clip  measuring 3.7 x 2.1 x 3.3 cm, previously measuring 2.7 x 1.5 x 2.3  cm.  IMPRESSION:  1. At the palpable site of concern in the right breast at 9 o'clock  there is a biopsy proven fibroadenoma that has increased in size now  measuring 3.7 cm, previously 2.7 cm.  2. No mammographic evidence of malignancy in the left breast.   RECOMMENDATION:  Surgical consultation to consider excision given interval growth of  the biopsied fibroadenoma in the right breast.   I have discussed the findings and recommendations with the patient.  If applicable, a reminder letter will be sent to the patient  regarding the next appointment.   BI-RADS CATEGORY 2: Benign.   Electronically Signed:  By: Allena Ito M.D.   Diagnosis 1. Breast, right, needle core biopsy, 10 o'clock - FIBROADENOMA - NO MALIGNANCY IDENTIFIED 2. Breast, left, needle core biopsy, 3 o'clock - FIBROADENOMA - NO MALIGNANCY IDENTIFIED Microscopic Comment 1. These results were called to [facility] on June 29, 2021. Candice Chalet MD Pathologist, Electronic Signature (Case signed 06/29/2021)  Assessment and Plan:   Diagnoses and all orders for this visit:  Fibroadenoma of breast, right   Recommend right breast seed lumpectomy due to increasing size. Risks and benefits and rationale for surgery explained to the patient. All questions were answered.  The procedure has been discussed with the patient. Alternatives to surgery have been discussed with the patient. Risks of surgery include bleeding, Infection, Seroma formation, death, and the need for further surgery. The patient understands and wishes to proceed.    Sharlee Dawes, MD

## 2023-09-06 ENCOUNTER — Other Ambulatory Visit: Payer: Self-pay

## 2023-09-06 ENCOUNTER — Ambulatory Visit
Admission: RE | Admit: 2023-09-06 | Discharge: 2023-09-06 | Disposition: A | Discharge status: Home / Self Care | Source: Ambulatory Visit | Attending: Surgery | Admitting: Surgery

## 2023-09-06 ENCOUNTER — Encounter (HOSPITAL_BASED_OUTPATIENT_CLINIC_OR_DEPARTMENT_OTHER)
Admission: RE | Disposition: A | Discharge status: Home / Self Care | Payer: Self-pay | Source: Home / Self Care | Attending: Surgery

## 2023-09-06 ENCOUNTER — Ambulatory Visit (HOSPITAL_BASED_OUTPATIENT_CLINIC_OR_DEPARTMENT_OTHER): Admitting: Anesthesiology

## 2023-09-06 ENCOUNTER — Ambulatory Visit (HOSPITAL_BASED_OUTPATIENT_CLINIC_OR_DEPARTMENT_OTHER)
Admission: RE | Admit: 2023-09-06 | Discharge: 2023-09-06 | Disposition: A | Discharge status: Home / Self Care | Attending: Surgery | Admitting: Surgery

## 2023-09-06 ENCOUNTER — Encounter (HOSPITAL_BASED_OUTPATIENT_CLINIC_OR_DEPARTMENT_OTHER): Payer: Self-pay | Admitting: Surgery

## 2023-09-06 DIAGNOSIS — M797 Fibromyalgia: Secondary | ICD-10-CM | POA: Diagnosis not present

## 2023-09-06 DIAGNOSIS — D241 Benign neoplasm of right breast: Secondary | ICD-10-CM | POA: Insufficient documentation

## 2023-09-06 DIAGNOSIS — K219 Gastro-esophageal reflux disease without esophagitis: Secondary | ICD-10-CM | POA: Diagnosis not present

## 2023-09-06 DIAGNOSIS — N6313 Unspecified lump in the right breast, lower outer quadrant: Secondary | ICD-10-CM

## 2023-09-06 DIAGNOSIS — N6311 Unspecified lump in the right breast, upper outer quadrant: Secondary | ICD-10-CM

## 2023-09-06 HISTORY — DX: Other specified postprocedural states: Z98.890

## 2023-09-06 HISTORY — PX: BREAST LUMPECTOMY WITH RADIOACTIVE SEED LOCALIZATION: SHX6424

## 2023-09-06 SURGERY — BREAST LUMPECTOMY WITH RADIOACTIVE SEED LOCALIZATION
Anesthesia: General | Site: Breast | Laterality: Right

## 2023-09-06 MED ORDER — SCOPOLAMINE 1 MG/3DAYS TD PT72
MEDICATED_PATCH | TRANSDERMAL | Status: AC
  Filled 2023-09-06: qty 1

## 2023-09-06 MED ORDER — DEXAMETHASONE SODIUM PHOSPHATE 10 MG/ML IJ SOLN
INTRAMUSCULAR | Status: DC | PRN
  Administered 2023-09-06: 5 mg via INTRAVENOUS

## 2023-09-06 MED ORDER — EPHEDRINE SULFATE-NACL 50-0.9 MG/10ML-% IV SOSY
PREFILLED_SYRINGE | INTRAVENOUS | Status: DC | PRN
  Administered 2023-09-06 (×2): 10 mg via INTRAVENOUS

## 2023-09-06 MED ORDER — DEXAMETHASONE SODIUM PHOSPHATE 10 MG/ML IJ SOLN
INTRAMUSCULAR | Status: AC
  Filled 2023-09-06: qty 1

## 2023-09-06 MED ORDER — FENTANYL CITRATE (PF) 100 MCG/2ML IJ SOLN
INTRAMUSCULAR | Status: AC
  Filled 2023-09-06: qty 2

## 2023-09-06 MED ORDER — DEXMEDETOMIDINE HCL IN NACL 80 MCG/20ML IV SOLN
INTRAVENOUS | Status: DC | PRN
  Administered 2023-09-06 (×2): 8 ug via INTRAVENOUS

## 2023-09-06 MED ORDER — 0.9 % SODIUM CHLORIDE (POUR BTL) OPTIME
TOPICAL | Status: DC | PRN
  Administered 2023-09-06: 150 mL

## 2023-09-06 MED ORDER — IBUPROFEN 800 MG PO TABS: 800.0000 mg | ORAL_TABLET | Freq: Three times a day (TID) | ORAL | 0 refills | Status: AC | PRN

## 2023-09-06 MED ORDER — FENTANYL CITRATE (PF) 100 MCG/2ML IJ SOLN
INTRAMUSCULAR | Status: DC | PRN
  Administered 2023-09-06 (×2): 50 ug via INTRAVENOUS

## 2023-09-06 MED ORDER — ACETAMINOPHEN 500 MG PO TABS
ORAL_TABLET | ORAL | Status: AC
  Filled 2023-09-06: qty 2

## 2023-09-06 MED ORDER — CELECOXIB 200 MG PO CAPS
ORAL_CAPSULE | ORAL | Status: AC
  Filled 2023-09-06: qty 1

## 2023-09-06 MED ORDER — GABAPENTIN 300 MG PO CAPS
300.0000 mg | ORAL_CAPSULE | ORAL | Status: AC
  Administered 2023-09-06: 300 mg via ORAL

## 2023-09-06 MED ORDER — CEFAZOLIN SODIUM-DEXTROSE 2-4 GM/100ML-% IV SOLN
INTRAVENOUS | Status: AC
  Filled 2023-09-06: qty 100

## 2023-09-06 MED ORDER — MIDAZOLAM HCL 2 MG/2ML IJ SOLN
INTRAMUSCULAR | Status: DC | PRN
  Administered 2023-09-06: 2 mg via INTRAVENOUS

## 2023-09-06 MED ORDER — BUPIVACAINE-EPINEPHRINE (PF) 0.25% -1:200000 IJ SOLN
INTRAMUSCULAR | Status: DC | PRN
  Administered 2023-09-06: 20 mL

## 2023-09-06 MED ORDER — FENTANYL CITRATE (PF) 100 MCG/2ML IJ SOLN
25.0000 ug | INTRAMUSCULAR | Status: DC | PRN
  Administered 2023-09-06: 25 ug via INTRAVENOUS
  Administered 2023-09-06: 50 ug via INTRAVENOUS
  Administered 2023-09-06: 25 ug via INTRAVENOUS
  Administered 2023-09-06: 50 ug via INTRAVENOUS

## 2023-09-06 MED ORDER — OXYCODONE HCL 5 MG/5ML PO SOLN: 5.0000 mg | Freq: Once | ORAL | Status: AC | PRN

## 2023-09-06 MED ORDER — DROPERIDOL 2.5 MG/ML IJ SOLN: 0.6250 mg | Freq: Once | INTRAMUSCULAR | Status: DC | PRN

## 2023-09-06 MED ORDER — PROPOFOL 500 MG/50ML IV EMUL
INTRAVENOUS | Status: AC
  Filled 2023-09-06: qty 100

## 2023-09-06 MED ORDER — CEFAZOLIN SODIUM-DEXTROSE 2-4 GM/100ML-% IV SOLN
2.0000 g | INTRAVENOUS | Status: AC
Start: 2023-09-06 — End: 2023-09-06
  Administered 2023-09-06: 2 g via INTRAVENOUS

## 2023-09-06 MED ORDER — ONDANSETRON HCL 4 MG/2ML IJ SOLN
INTRAMUSCULAR | Status: AC
  Filled 2023-09-06: qty 2

## 2023-09-06 MED ORDER — ACETAMINOPHEN 500 MG PO TABS
1000.0000 mg | ORAL_TABLET | ORAL | Status: AC
  Administered 2023-09-06: 1000 mg via ORAL

## 2023-09-06 MED ORDER — LIDOCAINE 2% (20 MG/ML) 5 ML SYRINGE
INTRAMUSCULAR | Status: AC
  Filled 2023-09-06: qty 5

## 2023-09-06 MED ORDER — DEXMEDETOMIDINE HCL IN NACL 80 MCG/20ML IV SOLN
INTRAVENOUS | Status: AC
  Filled 2023-09-06: qty 20

## 2023-09-06 MED ORDER — GABAPENTIN 300 MG PO CAPS
ORAL_CAPSULE | ORAL | Status: AC
  Filled 2023-09-06: qty 1

## 2023-09-06 MED ORDER — LIDOCAINE 2% (20 MG/ML) 5 ML SYRINGE
INTRAMUSCULAR | Status: DC | PRN
  Administered 2023-09-06: 40 mg via INTRAVENOUS

## 2023-09-06 MED ORDER — PROPOFOL 10 MG/ML IV BOLUS
INTRAVENOUS | Status: DC | PRN
  Administered 2023-09-06: 200 ug/kg/min via INTRAVENOUS
  Administered 2023-09-06: 170 mg via INTRAVENOUS

## 2023-09-06 MED ORDER — MIDAZOLAM HCL 2 MG/2ML IJ SOLN
INTRAMUSCULAR | Status: AC
  Filled 2023-09-06: qty 2

## 2023-09-06 MED ORDER — BUPIVACAINE-EPINEPHRINE (PF) 0.25% -1:200000 IJ SOLN
INTRAMUSCULAR | Status: AC
Start: 2023-09-06 — End: ?
  Filled 2023-09-06: qty 30

## 2023-09-06 MED ORDER — OXYCODONE HCL 5 MG PO TABS
5.0000 mg | ORAL_TABLET | Freq: Once | ORAL | Status: AC | PRN
  Administered 2023-09-06: 5 mg via ORAL

## 2023-09-06 MED ORDER — PROPOFOL 10 MG/ML IV BOLUS
INTRAVENOUS | Status: AC
  Filled 2023-09-06: qty 20

## 2023-09-06 MED ORDER — ONDANSETRON HCL 4 MG/2ML IJ SOLN
INTRAMUSCULAR | Status: DC | PRN
  Administered 2023-09-06: 4 mg via INTRAVENOUS

## 2023-09-06 MED ORDER — OXYCODONE HCL 5 MG PO TABS: 5.0000 mg | ORAL_TABLET | Freq: Four times a day (QID) | ORAL | 0 refills | Status: AC | PRN

## 2023-09-06 MED ORDER — LACTATED RINGERS IV SOLN: INTRAVENOUS | Status: DC

## 2023-09-06 MED ORDER — SCOPOLAMINE 1 MG/3DAYS TD PT72: 1.0000 | MEDICATED_PATCH | TRANSDERMAL | Status: DC

## 2023-09-06 MED ORDER — OXYCODONE HCL 5 MG PO TABS
ORAL_TABLET | ORAL | Status: AC
  Filled 2023-09-06: qty 1

## 2023-09-06 MED ORDER — CELECOXIB 200 MG PO CAPS
200.0000 mg | ORAL_CAPSULE | ORAL | Status: AC
  Administered 2023-09-06: 200 mg via ORAL

## 2023-09-06 MED ORDER — ACETAMINOPHEN 10 MG/ML IV SOLN: 1000.0000 mg | Freq: Once | INTRAVENOUS | Status: DC | PRN

## 2023-09-06 SURGICAL SUPPLY — 45 items
BINDER BREAST LRG (GAUZE/BANDAGES/DRESSINGS) IMPLANT
BINDER BREAST MEDIUM (GAUZE/BANDAGES/DRESSINGS) IMPLANT
BINDER BREAST XLRG (GAUZE/BANDAGES/DRESSINGS) IMPLANT
BINDER BREAST XXLRG (GAUZE/BANDAGES/DRESSINGS) IMPLANT
BLADE SURG 15 STRL LF DISP TIS (BLADE) ×1 IMPLANT
CANISTER SUC SOCK COL 7IN (MISCELLANEOUS) IMPLANT
CANISTER SUCT 1200ML W/VALVE (MISCELLANEOUS) IMPLANT
CHLORAPREP W/TINT 26 (MISCELLANEOUS) ×1 IMPLANT
CLIP APPLIE 9.375 MED OPEN (MISCELLANEOUS) IMPLANT
COVER BACK TABLE 60X90IN (DRAPES) ×1 IMPLANT
COVER MAYO STAND STRL (DRAPES) ×1 IMPLANT
COVER PROBE CYLINDRICAL 5X96 (MISCELLANEOUS) ×1 IMPLANT
DERMABOND ADVANCED .7 DNX12 (GAUZE/BANDAGES/DRESSINGS) ×1 IMPLANT
DRAPE LAPAROSCOPIC ABDOMINAL (DRAPES) IMPLANT
DRAPE LAPAROTOMY 100X72 PEDS (DRAPES) ×1 IMPLANT
DRAPE UTILITY XL STRL (DRAPES) ×1 IMPLANT
ELECT COATED BLADE 2.86 ST (ELECTRODE) ×1 IMPLANT
ELECTRODE REM PT RTRN 9FT ADLT (ELECTROSURGICAL) ×1 IMPLANT
GLOVE BIO SURGEON STRL SZ 6.5 (GLOVE) IMPLANT
GLOVE BIOGEL PI IND STRL 6.5 (GLOVE) IMPLANT
GLOVE BIOGEL PI IND STRL 7.0 (GLOVE) IMPLANT
GLOVE BIOGEL PI IND STRL 8 (GLOVE) ×1 IMPLANT
GLOVE ECLIPSE 6.5 STRL STRAW (GLOVE) IMPLANT
GLOVE ECLIPSE 8.0 STRL XLNG CF (GLOVE) ×1 IMPLANT
GOWN STRL REUS W/ TWL LRG LVL3 (GOWN DISPOSABLE) ×2 IMPLANT
GOWN STRL REUS W/ TWL XL LVL3 (GOWN DISPOSABLE) ×1 IMPLANT
HEMOSTAT ARISTA ABSORB 3G PWDR (HEMOSTASIS) IMPLANT
HEMOSTAT SNOW SURGICEL 2X4 (HEMOSTASIS) IMPLANT
KIT MARKER MARGIN INK (KITS) ×1 IMPLANT
NDL HYPO 25X1 1.5 SAFETY (NEEDLE) ×1 IMPLANT
NEEDLE HYPO 25X1 1.5 SAFETY (NEEDLE) ×1 IMPLANT
NS IRRIG 1000ML POUR BTL (IV SOLUTION) ×1 IMPLANT
PACK BASIN DAY SURGERY FS (CUSTOM PROCEDURE TRAY) ×1 IMPLANT
PENCIL SMOKE EVACUATOR (MISCELLANEOUS) ×1 IMPLANT
SLEEVE SCD COMPRESS KNEE MED (STOCKING) ×1 IMPLANT
SPIKE FLUID TRANSFER (MISCELLANEOUS) IMPLANT
SPONGE T-LAP 4X18 ~~LOC~~+RFID (SPONGE) ×1 IMPLANT
SUT MNCRL AB 4-0 PS2 18 (SUTURE) ×1 IMPLANT
SUT SILK 2 0 SH (SUTURE) IMPLANT
SUT VICRYL 3-0 CR8 SH (SUTURE) ×1 IMPLANT
SYR CONTROL 10ML LL (SYRINGE) ×1 IMPLANT
TOWEL GREEN STERILE FF (TOWEL DISPOSABLE) ×1 IMPLANT
TRAY FAXITRON CT DISP (TRAY / TRAY PROCEDURE) ×1 IMPLANT
TUBE CONNECTING 20X1/4 (TUBING) IMPLANT
YANKAUER SUCT BULB TIP NO VENT (SUCTIONS) IMPLANT

## 2023-09-06 NOTE — Interval H&P Note (Signed)
 History and Physical Interval Note:  09/06/2023 7:18 AM  Anna Chavez  has presented today for surgery, with the diagnosis of RIGHT BREAST MASS.  The various methods of treatment have been discussed with the patient and family. After consideration of risks, benefits and other options for treatment, the patient has consented to  Procedure(s) with comments: BREAST LUMPECTOMY WITH RADIOACTIVE SEED LOCALIZATION (Right) - RIGHT BREAST SEED LUMPECTOMY as a surgical intervention.  The patient's history has been reviewed, patient examined, no change in status, stable for surgery.  I have reviewed the patient's chart and labs.  Questions were answered to the patient's satisfaction.     Anna Chavez

## 2023-09-06 NOTE — Transfer of Care (Signed)
 Immediate Anesthesia Transfer of Care Note  Patient: Anna Chavez  Procedure(s) Performed: Procedure(s) (LRB): BREAST LUMPECTOMY WITH RADIOACTIVE SEED LOCALIZATION (Right)  Patient Location: PACU  Anesthesia Type: General  Level of Consciousness: awake, oriented, sedated and patient cooperative  Airway & Oxygen Therapy: Patient Spontanous Breathing Room Air  Post-op Assessment: Report given to PACU RN and Post -op Vital signs reviewed and stable  Post vital signs: Reviewed and stable  Complications: No apparent anesthesia complications Last Vitals:  Vitals Value Taken Time  BP 123/73 09/06/23 0845  Temp 36.2 C 09/06/23 0845  Pulse 75 09/06/23 0855  Resp 12 09/06/23 0851  SpO2 94 % 09/06/23 0855  Vitals shown include unfiled device data.  Last Pain:  Vitals:   09/06/23 0845  TempSrc:   PainSc: Asleep      Patients Stated Pain Goal: 3 (09/06/23 0646)  Complications: No notable events documented.

## 2023-09-06 NOTE — Op Note (Signed)
 Preoperative diagnosis: Right breast mass lower outer quadrant  Postoperative diagnosis: Same  Procedure: Right breast seed localized lumpectomy  Surgeon: Sim Dryer, MD  EBL: Minimal  Specimen: Right breast mass.  Margins around the mass were also excised.  All tissue was oriented with ink.  Drains: None  Indications for procedure: The patient is a 63 year old female with a right breast mass.  This core biopsy in the past found to be a fibroadenoma but is increased in size therefore excision recommended.The procedure has been discussed with the patient. Alternatives to surgery have been discussed with the patient.  Risks of surgery include bleeding,  Infection,  Seroma formation, death,  and the need for further surgery.   The patient understands and wishes to proceed.    Description of procedure: The patient was met in the holding area and questions were answered.  Of note she had a seed placed as an outpatient by radiology in the right breast.  Films were available for review.  All questions were answered.  She was taken back to the operating room.  She was placed supine upon the operating room table.  After induction of general anesthesia, right breast was prepped and draped in a sterile fashion and a timeout was performed.  Proper patient, site and procedure were verified.  Neoprobe was used to identify the seed in the right lower outer quadrant.  She had a previous breast reduction therefore the lower incision was used as access.  A 4 cm incision was made.  Dissection was carried down to the mass it was quite deep and actually adherent to the deep tissue planes of the breast.  The mass was smooth but lobulated in some areas.  It was also pedunculated.  I was able to mobilize the mass.  I did excise the mass from the pectoralis fascia deeply.  We also took some muscle fibers with fat.  The mass was removed entirely with grossly negative margins.  This was oriented with ink.  I did chose to  excise the area around it as well and all margins were reexcised around it.  These were all oriented with ink and sent separately with the specimen.  The cavity was made hemostatic with cautery.  Local anesthetic infiltrated.  Clips were placed to mark the cavity.  Deep tissue planes were approximated with 3-0 Vicryl.  4-0 Monocryl was used to close the skin in a subcuticular fashion.  Dermabond applied.  All counts found to be correct.  The patient was awoke extubated taken to recovery in satisfactory condition.

## 2023-09-06 NOTE — Anesthesia Postprocedure Evaluation (Signed)
 Anesthesia Post Note  Patient: Anna Chavez  Procedure(s) Performed: BREAST LUMPECTOMY WITH RADIOACTIVE SEED LOCALIZATION (Right: Breast)     Patient location during evaluation: PACU Anesthesia Type: General Level of consciousness: awake and alert Pain management: pain level controlled Vital Signs Assessment: post-procedure vital signs reviewed and stable Respiratory status: spontaneous breathing, nonlabored ventilation, respiratory function stable and patient connected to nasal cannula oxygen Cardiovascular status: blood pressure returned to baseline and stable Postop Assessment: no apparent nausea or vomiting Anesthetic complications: no   No notable events documented.  Last Vitals:  Vitals:   09/06/23 1045 09/06/23 1107  BP: 124/67 125/77  Pulse: 83 76  Resp: 16 16  Temp:  (!) 36.3 C  SpO2: 95% 95%    Last Pain:  Vitals:   09/06/23 1107  TempSrc: Temporal  PainSc:                  Willian Harrow

## 2023-09-06 NOTE — Anesthesia Preprocedure Evaluation (Addendum)
 Anesthesia Evaluation  Patient identified by MRN, date of birth, ID band Patient awake    Reviewed: Allergy & Precautions, NPO status , Patient's Chart, lab work & pertinent test results  History of Anesthesia Complications (+) PONV and history of anesthetic complications  Airway Mallampati: II  TM Distance: >3 FB Neck ROM: Full    Dental  (+) Teeth Intact, Dental Advisory Given   Pulmonary neg pulmonary ROS   breath sounds clear to auscultation       Cardiovascular negative cardio ROS  Rhythm:Regular Rate:Normal     Neuro/Psych  PSYCHIATRIC DISORDERS Anxiety Depression     Neuromuscular disease    GI/Hepatic Neg liver ROS,GERD  ,,  Endo/Other  negative endocrine ROS    Renal/GU negative Renal ROS     Musculoskeletal  (+)  Fibromyalgia -  Abdominal   Peds  Hematology negative hematology ROS (+)   Anesthesia Other Findings   Reproductive/Obstetrics                             Anesthesia Physical Anesthesia Plan  ASA: 2  Anesthesia Plan: General   Post-op Pain Management: Tylenol  PO (pre-op)*, Celebrex  PO (pre-op)*, Gabapentin  PO (pre-op)* and Toradol  IV (intra-op)*   Induction: Intravenous  PONV Risk Score and Plan: 4 or greater and Ondansetron , Dexamethasone , Midazolam , Scopolamine  patch - Pre-op and TIVA  Airway Management Planned: LMA  Additional Equipment: None  Intra-op Plan:   Post-operative Plan: Extubation in OR  Informed Consent: I have reviewed the patients History and Physical, chart, labs and discussed the procedure including the risks, benefits and alternatives for the proposed anesthesia with the patient or authorized representative who has indicated his/her understanding and acceptance.     Dental advisory given  Plan Discussed with: CRNA  Anesthesia Plan Comments:        Anesthesia Quick Evaluation

## 2023-09-06 NOTE — Anesthesia Procedure Notes (Signed)
 Procedure Name: LMA Insertion Date/Time: 09/06/2023 7:41 AM  Performed by: Glo Larch, CRNAPre-anesthesia Checklist: Patient identified, Emergency Drugs available, Suction available and Patient being monitored Patient Re-evaluated:Patient Re-evaluated prior to induction Oxygen Delivery Method: Circle system utilized Preoxygenation: Pre-oxygenation with 100% oxygen Induction Type: IV induction Ventilation: Mask ventilation without difficulty LMA: LMA inserted LMA Size: 4.0 Number of attempts: 1 Airway Equipment and Method: Bite block Placement Confirmation: positive ETCO2 Tube secured with: Tape Dental Injury: Teeth and Oropharynx as per pre-operative assessment

## 2023-09-06 NOTE — H&P (Signed)
 Chief Complaint: No chief complaint on file.  History of Present Illness: Anna Chavez is a 63 y.o. female who is seen today as an office consultation for evaluation of No chief complaint on file. Patient presents for evaluation of abnormal mammogram. 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 On recent imaging and an increase in size from 2.4 cm to 3.7 cm. Due to increasing size, excision recommended. Patient has a history of a left breast fibroadenoma which was not seen on this year's imaging.   Review of Systems: A complete review of systems was obtained from the patient. I have reviewed this information and discussed as appropriate with the patient. See HPI as well for other ROS.    Medical History: Past Medical History:  Diagnosis Date  Anxiety  Breast mass 2023  right breast  Chronic headaches  Depression  Easy bruising  Fibromyalgia  IBS (irritable bowel syndrome)  Thyroid  disease  local -biopsy   Patient Active Problem List  Diagnosis  Anxiety  Chronic headaches  Depression  Easy bruising  Fibromyalgia  IBS (irritable bowel syndrome)  Thyroid  disease   Past Surgical History:  Procedure Laterality Date  COMBINED REDUCTION MAMMAPLASTY W/ ABDOMINOPLASTY 2004  Endometrial polyp excision 2022  ARTHRODESIS ANTERIOR CERVICLE SPINE N/A 06/01/2022  Procedure: Anterior Cervical Discectomy and fusion C 4-5, 5-6, 6-7, C7-T1 with Medtronic Cornerstone LS Allograft and Medtronic Atlantis plating; Surgeon: [employer], MD; Location: [facility] OR; Service: Neurosurgery; Laterality: N/A;  INSTRUMENTATION ANTERIOR SPINE 8/MORE SEGMENTS N/A 06/01/2022  Procedure: Additional levels; Surgeon: [employer], MD; Location: [facility] OR; Service: Neurosurgery; Laterality: N/A;  INSERTION STRUCTURAL BONE ALLOGRAFT FOR SPINE SURGERY N/A 06/01/2022  Procedure: INSERTION STRUCTURAL BONE ALLOGRAFT FOR SPINE SURGERY [phone]; Surgeon: [employer], MD;  Location: [facility] OR; Service: Neurosurgery; Laterality: N/A;  ARTHRODESIS ANTERIOR CERVICLE SPINE N/A 06/01/2022  Procedure: ARTHRODESIS ANT INTERBODY INC DISCECTOMY, CERVICAL BELOW C2 EACH ADDL; Surgeon: [employer], MD; Location: [facility] OR; Service: Neurosurgery; Laterality: N/A;  Excision of elbow cyst Left  infertility surgeries  with biopsies    No Known Allergies  Current Outpatient Medications on File Prior to Visit  Medication Sig Dispense Refill  acetaminophen  (TYLENOL ) 325 MG tablet 325 mg  busPIRone (BUSPAR) 15 MG tablet Take 15 mg by mouth every morning  celecoxib  (CELEBREX ) 200 MG capsule Take 1 capsule (200 mg total) by mouth 2 (two) times daily MAY RESTART ONE WEEK AFTER SURGERY. ASK THE PRESCRIBER TO TRY TO WEAN THIS OFF OR A LEAST LOWER DOSE (Patient not taking: Reported on 08/02/2023)  DULoxetine (CYMBALTA) 20 MG DR capsule Take 40 mg by mouth every morning  gabapentin  (NEURONTIN ) 100 MG capsule Take 3 capsules by mouth at bedtime  methocarbamoL (ROBAXIN) 500 MG tablet Take 1 tablet (500 mg total) by mouth 3 (three) times daily as needed 90 tablet 2  multivit-iron-FA-calcium-mins (WOMEN'S ONE DAILY) 18 mg iron-400 mcg-500 mg Ca Tab as directed Orally  valACYclovir  (VALTREX ) 500 MG tablet Take 500 mg by mouth at bedtime   No current facility-administered medications on file prior to visit.   Family History  Problem Relation Age of Onset  Diabetes type II Mother  Heart disease Mother  Pancreatic cancer Mother  Heart disease Father  Myocardial Infarction (Heart attack) Father    Social History   Tobacco Use  Smoking Status Never  Passive exposure: Past (Parents smoked in the house.)  Smokeless Tobacco Never    Social History   Socioeconomic History  Marital status: Divorced  Occupational History  Occupation: Writer  Comment: not able to work due to symptoms out on [employer] as of 03/06/22  Tobacco Use  Smoking status: Never  Passive exposure: Past  (Parents smoked in the house.)  Smokeless tobacco: Never  Vaping Use  Vaping status: Never Used  Substance and Sexual Activity  Alcohol use: Yes  Alcohol/week: 1.0 - 2.0 standard drink of alcohol  Types: 1 - 2 Glasses of wine per week  Drug use: Never   Social Drivers of Health   Financial Resource Strain: Medium Risk (08/02/2023)  Overall Financial Resource Strain (CARDIA)  Difficulty of Paying Living Expenses: Somewhat hard  Food Insecurity: No Food Insecurity (08/02/2023)  Hunger Vital Sign  Worried About Running Out of Food in the Last Year: Never true  Ran Out of Food in the Last Year: Never true  Transportation Needs: No Transportation Needs (08/02/2023)  PRAPARE - Risk analyst (Medical): No  Lack of Transportation (Non-Medical): No  Housing Stability: Low Risk (08/02/2023)  Housing Stability Vital Sign  Unable to Pay for Housing in the Last Year: No  Number of Times Moved in the Last Year: 0  Homeless in the Last Year: No   Objective:  There were no vitals filed for this visit.  There is no height or weight on file to calculate BMI.  Physical Exam Exam conducted with a chaperone present.  HENT:  Head: Normocephalic.  Cardiovascular:  Rate and Rhythm: Normal rate.  Pulmonary:  Effort: Pulmonary effort is normal.  Chest:  Breasts: Right: Mass present.  Left: Normal. No mass.   Comments: 3 cm mass  Lymphadenopathy:  Upper Body:  Right upper body: No supraclavicular or axillary adenopathy.  Left upper body: No supraclavicular or axillary adenopathy.  Skin: General: Skin is warm.  Neurological:  General: No focal deficit present.  Mental Status: She is alert.  Psychiatric:  Mood and Affect: Mood normal.     Labs, Imaging and Diagnostic Testing:  CLINICAL DATA: 63-year-old female presenting for evaluation of a new  lump in the outer right breast. Patient is also due for annual exam  of the left breast.   EXAM:  DIGITAL  DIAGNOSTIC BILATERAL MAMMOGRAM WITH TOMOSYNTHESIS AND CAD;  ULTRASOUND RIGHT BREAST LIMITED   TECHNIQUE:  Bilateral digital diagnostic mammography and breast tomosynthesis  was performed. The images were evaluated with computer-aided  detection. ; Targeted ultrasound examination of the right breast was  performed   COMPARISON: Previous exam(s).   ACR Breast Density Category b: There are scattered areas of  fibroglandular density.   FINDINGS:  Mammogram:   Right breast: A skin BB marks the palpable site of concern reported  by the patient in the outer right breast. This appears to correspond  to a in oval circumscribed mass that was previously biopsied  demonstrating fibroadenoma which has enlarged compared to the prior  mammogram. There are no new findings elsewhere in the right breast.   Left breast: No suspicious mass, distortion, or microcalcifications  are identified to suggest presence of malignancy.   On physical exam at the site of concern reported by the patient in  the outer right breast I feel a discrete mass.   Ultrasound:   Targeted ultrasound performed at the site of concern in the right  breast at 9 o'clock 10 cm from the nipple demonstrating an oval  circumscribed hypoechoic mass containing a biopsy marking clip  measuring 3.7 x 2.1 x 3.3 cm, previously measuring  2.7 x 1.5 x 2.3  cm.   IMPRESSION:  1. At the palpable site of concern in the right breast at 9 o'clock  there is a biopsy proven fibroadenoma that has increased in size now  measuring 3.7 cm, previously 2.7 cm.  2. No mammographic evidence of malignancy in the left breast.   RECOMMENDATION:  Surgical consultation to consider excision given interval growth of  the biopsied fibroadenoma in the right breast.   I have discussed the findings and recommendations with the patient.  If applicable, a reminder letter will be sent to the patient  regarding the next appointment.   BI-RADS CATEGORY 2:  Benign.   Electronically Signed:  By: Allena Ito M.D.   Diagnosis 1. Breast, right, needle core biopsy, 10 o'clock - FIBROADENOMA - NO MALIGNANCY IDENTIFIED 2. Breast, left, needle core biopsy, 3 o'clock - FIBROADENOMA - NO MALIGNANCY IDENTIFIED Microscopic Comment 1. These results were called to [facility] on June 29, 2021. Candice Chalet MD Pathologist, Electronic Signature (Case signed 06/29/2021)  Assessment and Plan:   Diagnoses and all orders for this visit:  Fibroadenoma of breast, right   Recommend right breast seed lumpectomy due to increasing size. Risks and benefits and rationale for surgery explained to the patient. All questions were answered.  The procedure has been discussed with the patient. Alternatives to surgery have been discussed with the patient. Risks of surgery include bleeding, Infection, Seroma formation, death, and the need for further surgery. The patient understands and wishes to proceed.    Sharlee Dawes, MD   I spent a total of 47 minutes in both face-to-face and non-face-to-face activities, excluding procedures performed, for this visit on the date of this encounter.

## 2023-09-06 NOTE — Discharge Instructions (Addendum)
 [facility] Office Phone Number [phone]  BREAST BIOPSY/ PARTIAL MASTECTOMY: POST OP INSTRUCTIONS  Always review your discharge instruction sheet given to you by the facility where your surgery was performed.  IF YOU HAVE DISABILITY OR FAMILY LEAVE FORMS, YOU MUST BRING THEM TO THE OFFICE FOR PROCESSING.  DO NOT GIVE THEM TO YOUR DOCTOR.  A prescription for pain medication may be given to you upon discharge.  Take your pain medication as prescribed, if needed.  If narcotic pain medicine is not needed, then you may take acetaminophen  (Tylenol ) or ibuprofen  (Advil ) as needed. Take your usually prescribed medications unless otherwise directed If you need a refill on your pain medication, please contact your pharmacy.  They will contact our office to request authorization.  Prescriptions will not be filled after 5pm or on week-ends. You should eat very light the first 24 hours after surgery, such as soup, crackers, pudding, etc.  Resume your normal diet the day after surgery. Most patients will experience some swelling and bruising in the breast.  Ice packs and a good support bra will help.  Swelling and bruising can take several days to resolve.  It is common to experience some constipation if taking pain medication after surgery.  Increasing fluid intake and taking a stool softener will usually help or prevent this problem from occurring.  A mild laxative (Milk of Magnesia or Miralax) should be taken according to package directions if there are no bowel movements after 48 hours. Unless discharge instructions indicate otherwise, you may remove your bandages 24-48 hours after surgery, and you may shower at that time.  You may have steri-strips (small skin tapes) in place directly over the incision.  These strips should be left on the skin for 7-10 days.  If your surgeon used skin glue on the incision, you may shower in 24 hours.  The glue will flake off over the next 2-3 weeks.  Any  sutures or staples will be removed at the office during your follow-up visit. ACTIVITIES:  You may resume regular daily activities (gradually increasing) beginning the next day.  Wearing a good support bra or sports bra minimizes pain and swelling.  You may have sexual intercourse when it is comfortable. You may drive when you no longer are taking prescription pain medication, you can comfortably wear a seatbelt, and you can safely maneuver your car and apply brakes. RETURN TO WORK:  ______________________________________________________________________________________ [name] should see your doctor in the office for a follow-up appointment approximately two weeks after your surgery.  Your doctor's nurse will typically make your follow-up appointment when she calls you with your pathology report.  Expect your pathology report 2-3 business days after your surgery.  You may call to check if you do not hear from us  after three days. OTHER INSTRUCTIONS: _______________________________________________________________________________________________ _____________________________________________________________________________________________________________________________________ _____________________________________________________________________________________________________________________________________ _____________________________________________________________________________________________________________________________________  WHEN TO CALL YOUR DOCTOR: Fever over 101.0 Nausea and/or vomiting. Extreme swelling or bruising. Continued bleeding from incision. Increased pain, redness, or drainage from the incision.  The clinic staff is available to answer your questions during regular business hours.  Please don't hesitate to call and ask to speak to one of the nurses for clinical concerns.  If you have a medical emergency, go to the nearest emergency room or call 911.  A surgeon from [facility] is always on call at the hospital.  For further questions, please visit [email or web address]    No Tylenol  until 1:00pm, if needed.

## 2023-09-07 ENCOUNTER — Encounter (HOSPITAL_BASED_OUTPATIENT_CLINIC_OR_DEPARTMENT_OTHER): Payer: Self-pay | Admitting: Surgery

## 2023-09-07 NOTE — Telephone Encounter (Signed)
 Rx was not refilled or refused. Message was sent to me in Rx Request.   Per review of EPIC, breast surgery completed on 09/06/23.   Call placed to patient. Patient did not stop vagifem , needs refill. States she was not instructed by surgeon to stop. Patient reports she is doing well after procedure. Advised patient I will provide update to Dr. Colvin Dec and f/u with recommendations on Monday. Patient states she is running low on medication and would like refill.    Dr. Colvin Dec -please review and advise.

## 2023-09-10 LAB — SURGICAL PATHOLOGY

## 2023-09-11 ENCOUNTER — Encounter: Payer: Self-pay | Admitting: Surgery

## 2023-09-14 ENCOUNTER — Encounter: Admitting: Psychology

## 2023-09-17 ENCOUNTER — Other Ambulatory Visit: Payer: Self-pay | Admitting: Obstetrics and Gynecology

## 2023-09-20 NOTE — Telephone Encounter (Signed)
 Medication refill request: vagifem   Last AEX:  07/09/23 Next AEX: 03/10/24  Last MMG (if hormonal medication request): 09/06/23  Refill authorized: medication was refilled 09/10/23 #24 with 2 refills

## 2023-10-26 ENCOUNTER — Ambulatory Visit: Admitting: Physician Assistant

## 2023-10-30 DIAGNOSIS — F411 Generalized anxiety disorder: Secondary | ICD-10-CM | POA: Diagnosis not present

## 2023-11-02 ENCOUNTER — Encounter: Payer: Self-pay | Admitting: Physician Assistant

## 2023-12-01 DIAGNOSIS — F411 Generalized anxiety disorder: Secondary | ICD-10-CM | POA: Diagnosis not present

## 2023-12-10 ENCOUNTER — Ambulatory Visit: Payer: Self-pay

## 2023-12-10 ENCOUNTER — Institutional Professional Consult (permissible substitution): Admitting: Psychology

## 2023-12-11 ENCOUNTER — Other Ambulatory Visit: Payer: Self-pay

## 2023-12-11 NOTE — Telephone Encounter (Signed)
 Received refill request via fax  Med refill request: Vagifem  10 mcg vaginal tab Last AEX: 07/09/23 BS Next AEX: 03/10/24 BS Last MMG (if hormonal med) 07/20/23 Refill authorized: Last Rx sent #24 with 2 refills on 09/10/23 BS. Please approve or deny

## 2023-12-12 MED ORDER — ESTRADIOL 10 MCG VA TABS: 10.0000 ug | ORAL_TABLET | VAGINAL | 2 refills | Status: DC

## 2023-12-17 ENCOUNTER — Encounter: Admitting: Psychology

## 2023-12-21 ENCOUNTER — Ambulatory Visit: Payer: 59 | Admitting: Obstetrics

## 2023-12-28 ENCOUNTER — Emergency Department (HOSPITAL_BASED_OUTPATIENT_CLINIC_OR_DEPARTMENT_OTHER): Admitting: Radiology

## 2023-12-28 ENCOUNTER — Encounter (HOSPITAL_BASED_OUTPATIENT_CLINIC_OR_DEPARTMENT_OTHER): Payer: Self-pay | Admitting: Emergency Medicine

## 2023-12-28 ENCOUNTER — Other Ambulatory Visit: Payer: Self-pay

## 2023-12-28 ENCOUNTER — Emergency Department (HOSPITAL_BASED_OUTPATIENT_CLINIC_OR_DEPARTMENT_OTHER)
Admission: EM | Admit: 2023-12-28 | Discharge: 2023-12-28 | Disposition: A | Discharge status: Home / Self Care | Attending: Emergency Medicine | Admitting: Emergency Medicine

## 2023-12-28 DIAGNOSIS — M5459 Other low back pain: Secondary | ICD-10-CM | POA: Diagnosis not present

## 2023-12-28 DIAGNOSIS — G8929 Other chronic pain: Secondary | ICD-10-CM | POA: Insufficient documentation

## 2023-12-28 DIAGNOSIS — Z79899 Other long term (current) drug therapy: Secondary | ICD-10-CM | POA: Diagnosis not present

## 2023-12-28 DIAGNOSIS — M47814 Spondylosis without myelopathy or radiculopathy, thoracic region: Secondary | ICD-10-CM | POA: Diagnosis not present

## 2023-12-28 DIAGNOSIS — M79603 Pain in arm, unspecified: Secondary | ICD-10-CM | POA: Insufficient documentation

## 2023-12-28 DIAGNOSIS — M546 Pain in thoracic spine: Secondary | ICD-10-CM | POA: Insufficient documentation

## 2023-12-28 DIAGNOSIS — R0989 Other specified symptoms and signs involving the circulatory and respiratory systems: Secondary | ICD-10-CM | POA: Diagnosis not present

## 2023-12-28 DIAGNOSIS — M542 Cervicalgia: Secondary | ICD-10-CM | POA: Diagnosis not present

## 2023-12-28 DIAGNOSIS — Z981 Arthrodesis status: Secondary | ICD-10-CM | POA: Diagnosis not present

## 2023-12-28 MED ORDER — KETOROLAC TROMETHAMINE 30 MG/ML IJ SOLN
60.0000 mg | Freq: Once | INTRAMUSCULAR | Status: AC
  Administered 2023-12-28: 60 mg via INTRAMUSCULAR
  Filled 2023-12-28: qty 2

## 2023-12-28 MED ORDER — PREDNISONE 10 MG (21) PO TBPK: ORAL_TABLET | Freq: Every day | ORAL | 0 refills | Status: AC

## 2023-12-28 MED ORDER — OXYCODONE-ACETAMINOPHEN 5-325 MG PO TABS
1.0000 | ORAL_TABLET | Freq: Once | ORAL | Status: AC
  Administered 2023-12-28: 1 via ORAL
  Filled 2023-12-28: qty 1

## 2023-12-28 MED ORDER — METHOCARBAMOL 500 MG PO TABS
500.0000 mg | ORAL_TABLET | Freq: Once | ORAL | Status: DC
  Filled 2023-12-28: qty 1

## 2023-12-28 MED ORDER — OXYCODONE-ACETAMINOPHEN 5-325 MG PO TABS: 1.0000 | ORAL_TABLET | Freq: Four times a day (QID) | ORAL | 0 refills | Status: AC | PRN

## 2023-12-28 MED ORDER — METHOCARBAMOL 500 MG PO TABS: 500.0000 mg | ORAL_TABLET | Freq: Four times a day (QID) | ORAL | 0 refills | Status: AC | PRN

## 2023-12-28 NOTE — ED Notes (Signed)
 Reviewed discharge instructions, medications, and home care with pt. Pt verbalized understanding and had no further questions. Pt exited ED without complications.

## 2023-12-28 NOTE — ED Notes (Signed)
 ED Provider at bedside.

## 2023-12-28 NOTE — ED Triage Notes (Signed)
 Pt complains of severe neck and back pain. Pt had a neck fusion 1.5 years ago. Muscle relaxer's at home are not helping. Denies any injury. Pt crying in pain during triage. Every small movement causes stabbing pain from neck/upper back and back of both arms. Past few days started to have headaches.

## 2023-12-28 NOTE — ED Notes (Addendum)
 Patient transported to X-Ray

## 2023-12-28 NOTE — ED Provider Notes (Signed)
 [place] EMERGENCY DEPARTMENT AT [facility] Provider Note   CSN: [id number] Arrival date & time: 12/28/23  1222     Patient presents with: Back Pain and Neck Pain   Anna Chavez is a 63 y.o. female.   HPI Patient reports she has had chronic pain in her neck and upper back since cervical surgery anterior discectomy and fusion at C4 5-6 5 7  C7 15 May 2022 by Dr. Samule at [facility].  At that time patient was experiencing neck and arm pain with severe pain radiating to her scapula a mostly on the right.  She reports she has lived with pain ever since that time.  She reports however over the past few days she has got much worse exacerbation of pain.  She is having sharp pains stabbing and radiating around her posterior thoracic back.  She denies any associated chest pain.  It radiates from underneath the scapula and she indicates a cape like area of pain over her trapezius on both sides and mid thoracic back as well.  No cough, no fever, no chills, no chest pain, no abdominal pain, no nausea, no vomiting.  She is not getting any relief with her Robaxin .  Patient reports that she has been discharged from the neurosurgical service and they are not seeing her again.    Prior to Admission medications   Medication Sig Start Date End Date Taking? Authorizing Provider  busPIRone (BUSPAR) 5 MG tablet Take 5 mg by mouth 2 (two) times daily. 10/25/23  Yes [provider]  gabapentin  (NEURONTIN ) 100 MG capsule Take 100 mg by mouth 3 (three) times daily. 10/30/23  Yes [provider]  methocarbamol  (ROBAXIN ) 500 MG tablet Take 1 tablet (500 mg total) by mouth every 6 (six) hours as needed for muscle spasms. 12/28/23  Yes Armenta Canning, MD  oxyCODONE -acetaminophen  (PERCOCET/ROXICET) 5-325 MG tablet Take 1 tablet by mouth every 6 (six) hours as needed for severe pain (pain score 7-10). 12/28/23  Yes Codie Hainer, Canning, MD  predniSONE  (STERAPRED UNI-PAK 21 TAB) 10 MG (21) TBPK  tablet Take by mouth daily. Take 6 tabs by mouth daily  for 2 days, then 5 tabs for 2 days, then 4 tabs for 2 days, then 3 tabs for 2 days, 2 tabs for 2 days, then 1 tab by mouth daily for 2 days 12/28/23  Yes Yaniel Limbaugh, Canning, MD  ALPRAZolam (XANAX) 0.5 MG tablet Take 0.5 mg by mouth 2 (two) times daily as needed. 02/03/21   [provider]  busPIRone (BUSPAR) 15 MG tablet Take 15 mg by mouth 2 (two) times daily. 09/21/17   [provider]  DULoxetine (CYMBALTA) 20 MG capsule Take 40 mg by mouth daily. 12/24/22   [provider]  Estradiol  (VAGIFEM ) 10 MCG TABS vaginal tablet Place 1 tablet (10 mcg total) vaginally 2 (two) times a week. 12/13/23   Cathlyn JAYSON Nikki Bobie FORBES, MD  gabapentin  (NEURONTIN ) 300 MG capsule Take 1 capsule (300 mg) nightly. Patient taking differently: Take 300 mg by mouth at bedtime. Take 1 capsule (300 mg) nightly. 04/04/21   Amundson C Silva, Brook E, MD  ibuprofen  (ADVIL ) 800 MG tablet Take 1 tablet (800 mg total) by mouth every 8 (eight) hours as needed. 09/06/23   Vanderbilt Ned, MD  mirabegron  ER (MYRBETRIQ ) 25 MG TB24 tablet Take 1 tablet (25 mg total) by mouth daily. Patient not taking: Reported on 07/09/2023 07/06/23   Guadlupe Dull T, MD  Multiple Vitamin (MULTIVITAMIN) capsule Take 1 capsule  by mouth daily.    [provider]  oxyCODONE  (OXY IR/ROXICODONE ) 5 MG immediate release tablet Take 1 tablet (5 mg total) by mouth every 6 (six) hours as needed for severe pain (pain score 7-10). 09/06/23   Cornett, Debby, MD  valACYclovir  (VALTREX ) 500 MG tablet Take 1 tablet (500 mg total) by mouth daily. Take one tablet by mouth twice a day for 3 days for an outbreak. 07/09/23   Cathlyn JAYSON Nikki Bobie FORBES, MD  Vibegron  (GEMTESA ) 75 MG TABS Take 1 tablet (75 mg total) by mouth daily. 05/25/23   Guadlupe Lianne DASEN, MD    Allergies: Patient has no known allergies.    Review of Systems  Updated Vital Signs BP (!) 132/96 (BP Location: Right Arm)   Pulse 89    Temp 98.7 F (37.1 C) (Oral)   Resp 18   Ht 5' 4 (1.626 m)   Wt 85 kg   LMP 05/15/2016 (Approximate)   SpO2 99%   BMI 32.17 kg/m   Physical Exam Constitutional:      Comments: Alert nontoxic clinically well in appearance.  Patient is reporting severe pain.  No respiratory distress.  HENT:     Head: Normocephalic and atraumatic.     Mouth/Throat:     Pharynx: Oropharynx is clear.  Eyes:     Extraocular Movements: Extraocular movements intact.  Cardiovascular:     Rate and Rhythm: Normal rate and regular rhythm.  Pulmonary:     Effort: Pulmonary effort is normal.     Breath sounds: Normal breath sounds.  Abdominal:     General: There is no distension.     Palpations: Abdomen is soft.     Tenderness: There is no abdominal tenderness. There is no guarding.  Musculoskeletal:     Cervical back: Neck supple.     Comments: Patient does not have mobility limitation she can sit forward in the stretcher and move about.  She can use both arms to point out areas of pain in her back by reaching across the tops of her shoulders and to the center of her back.  Lower extremities no peripheral edema calves are soft and nontender.  Skin:    General: Skin is warm and dry.  Neurological:     General: No focal deficit present.     Mental Status: She is oriented to person, place, and time.     Motor: No weakness.     Coordination: Coordination normal.     (all labs ordered are listed, but only abnormal results are displayed) Labs Reviewed - No data to display  EKG: None  Radiology: DG Thoracic Spine 2 View Result Date: 12/28/2023 CLINICAL DATA:  thoracic back pain. EXAM: THORACIC SPINE 2 VIEWS COMPARISON:  None Available. FINDINGS: Unremarkable spinal curvature. Cervicothoracic ACDF noted. No spondylolisthesis. Vertebral body heights are maintained. No aggressive osseous lesion. Mild multilevel degenerative changes in the form of marginal osteophyte formation. Intervertebral disc heights  are largely maintained. Visualized soft tissues are within normal limits. IMPRESSION: *No acute osseous abnormality of the thoracic spine. *Mild multilevel degenerative changes. Electronically Signed   By: Ree Molt M.D.   On: 12/28/2023 15:55   DG Chest 2 View Result Date: 12/28/2023 CLINICAL DATA:  thoracic back pain EXAM: CHEST - 2 VIEW COMPARISON:  05/20/2019. FINDINGS: Low lung volume. Bilateral lung fields are clear. Bilateral costophrenic angles are clear. Normal cardio-mediastinal silhouette. No acute osseous abnormalities. Cervicothoracic spinal fixation hardware noted. The soft tissues are within normal limits. IMPRESSION:  No active cardiopulmonary disease. Electronically Signed   By: Ree Molt M.D.   On: 12/28/2023 15:53     Procedures   Medications Ordered in the ED  methocarbamol  (ROBAXIN ) tablet 500 mg (500 mg Oral Patient Refused/Not Given 12/28/23 1520)  ketorolac  (TORADOL ) 30 MG/ML injection 60 mg (60 mg Intramuscular Given 12/28/23 1520)  oxyCODONE -acetaminophen  (PERCOCET/ROXICET) 5-325 MG per tablet 1 tablet (1 tablet Oral Given 12/28/23 1528)                                    Medical Decision Making Amount and/or Complexity of Data Reviewed Radiology: ordered.  Risk Prescription drug management.   Patient presents as outlined.  She qualifies thoracic pain as worse than her baseline.  Distribution suggest musculoskeletal pain.  She outlines both of the trapezius areas and beneath the scapula a.  She reports there are certain position changes and she gets knifelike pains.  The patient does not have typical associated symptoms that would suggest PE\pneumonia\pneumothorax.  Will obtain chest x-ray and thoracic spine x-rays.  Chest x-ray inter by radiology no acute findings.  No pneumothorax or infiltrate present.  Thoracic spine interpreted radiology degenerative changes but normal alignment no other acute findings.  Patient was treated in the emergency  department with IM Toradol  oral Percocet and oral Robaxin .  She is more comfortable in appearance and resting now.  At this time we will opt to treat with prednisone  for acute exacerbation of musculoskeletal pain.  Robaxin  for what appears to be significant muscle spasm over much of the thoracic back and Percocet for severe pain exacerbation.  Patient is counseled she needs to follow-up with her PCP to discuss ongoing pain management and discussion if referral to neurosurgery or MRI are indicated.  At this time I do not see acute indication for MRI.  Patient has good neurologic function of all extremities with no signs of immediate surgical emergency.     Final diagnoses:  Acute on chronic back pain    ED Discharge Orders          Ordered    predniSONE  (STERAPRED UNI-PAK 21 TAB) 10 MG (21) TBPK tablet  Daily        12/28/23 1619    oxyCODONE -acetaminophen  (PERCOCET/ROXICET) 5-325 MG tablet  Every 6 hours PRN        12/28/23 1619    methocarbamol  (ROBAXIN ) 500 MG tablet  Every 6 hours PRN        12/28/23 1619               Armenta Canning, MD 12/28/23 [phone]

## 2023-12-28 NOTE — Discharge Instructions (Addendum)
 1.  Take prednisone  taper as prescribed.  This will help with inflammation and acute worsening of your chronic back pain. 2.  Take Robaxin  as a muscle relaxer.  A lot of your pain appears to be due to muscle spasm. 3.  You may take Percocet sparingly for severe pain.  Discontinue soon as possible.  This medication is addicting and can cause constipation.  As soon as your pain is reasonably controlled, start taking extra strength Tylenol . 4.  Call your family doctor to discuss your needs for ongoing management of chronic pain.

## 2023-12-28 NOTE — ED Triage Notes (Signed)
Heat packs provided to pt.

## 2024-01-01 ENCOUNTER — Other Ambulatory Visit: Payer: Self-pay

## 2024-01-01 ENCOUNTER — Ambulatory Visit: Admitting: Physician Assistant

## 2024-01-01 DIAGNOSIS — Z780 Asymptomatic menopausal state: Secondary | ICD-10-CM

## 2024-01-01 DIAGNOSIS — G8929 Other chronic pain: Secondary | ICD-10-CM | POA: Diagnosis not present

## 2024-01-01 DIAGNOSIS — Z6831 Body mass index (BMI) 31.0-31.9, adult: Secondary | ICD-10-CM | POA: Diagnosis not present

## 2024-01-01 DIAGNOSIS — M9951 Intervertebral disc stenosis of neural canal of cervical region: Secondary | ICD-10-CM | POA: Diagnosis not present

## 2024-01-01 MED ORDER — ESTRADIOL 10 MCG VA TABS
10.0000 ug | ORAL_TABLET | VAGINAL | 2 refills | Status: DC
Start: 2024-01-03 — End: 2024-03-07

## 2024-01-01 NOTE — Telephone Encounter (Signed)
 Patient LM on RF line:  patient requests a new RX for estradiol  10 mcg vaginal inserts be sent to [facility] @ [place] (due to cost).   Med refill request: estradiol  10 mcg vaginal inserts Last AEX: 07/09/23 Next OV: 03/10/24 (pap) Last MMG (if hormonal med) 07/20/23 BI-RADS 1 negative Refill authorized: Please Advise?

## 2024-01-29 DIAGNOSIS — F411 Generalized anxiety disorder: Secondary | ICD-10-CM | POA: Diagnosis not present

## 2024-02-15 DIAGNOSIS — G8929 Other chronic pain: Secondary | ICD-10-CM | POA: Diagnosis not present

## 2024-02-15 DIAGNOSIS — M5416 Radiculopathy, lumbar region: Secondary | ICD-10-CM | POA: Diagnosis not present

## 2024-02-15 DIAGNOSIS — Z23 Encounter for immunization: Secondary | ICD-10-CM | POA: Diagnosis not present

## 2024-02-15 DIAGNOSIS — M546 Pain in thoracic spine: Secondary | ICD-10-CM | POA: Diagnosis not present

## 2024-02-15 DIAGNOSIS — M542 Cervicalgia: Secondary | ICD-10-CM | POA: Diagnosis not present

## 2024-02-20 DIAGNOSIS — H0100A Unspecified blepharitis right eye, upper and lower eyelids: Secondary | ICD-10-CM | POA: Diagnosis not present

## 2024-02-20 DIAGNOSIS — H0100B Unspecified blepharitis left eye, upper and lower eyelids: Secondary | ICD-10-CM | POA: Diagnosis not present

## 2024-02-20 DIAGNOSIS — H26493 Other secondary cataract, bilateral: Secondary | ICD-10-CM | POA: Diagnosis not present

## 2024-02-20 DIAGNOSIS — H04123 Dry eye syndrome of bilateral lacrimal glands: Secondary | ICD-10-CM | POA: Diagnosis not present

## 2024-03-04 DIAGNOSIS — S239XXD Sprain of unspecified parts of thorax, subsequent encounter: Secondary | ICD-10-CM | POA: Diagnosis not present

## 2024-03-05 DIAGNOSIS — M47812 Spondylosis without myelopathy or radiculopathy, cervical region: Secondary | ICD-10-CM | POA: Diagnosis not present

## 2024-03-05 DIAGNOSIS — M7632 Iliotibial band syndrome, left leg: Secondary | ICD-10-CM | POA: Diagnosis not present

## 2024-03-05 DIAGNOSIS — M47817 Spondylosis without myelopathy or radiculopathy, lumbosacral region: Secondary | ICD-10-CM | POA: Diagnosis not present

## 2024-03-05 DIAGNOSIS — M7630 Iliotibial band syndrome, unspecified leg: Secondary | ICD-10-CM | POA: Diagnosis not present

## 2024-03-07 ENCOUNTER — Other Ambulatory Visit: Payer: Self-pay

## 2024-03-07 DIAGNOSIS — Z780 Asymptomatic menopausal state: Secondary | ICD-10-CM

## 2024-03-07 NOTE — Telephone Encounter (Signed)
 Med refill request: Vagifem   Last AEX: 07/09/23 Next AEX: 08/13/24 Last MMG (if hormonal med) 07/20/23 BIRADS Cat 2 benign  Refill authorized: last rx 01/03/24 #24 with 2 refills. Patient called and left message in voicemail line stating that she has about 2 weeks left of her prescription and is just wanting to go ahead and get refill to avoid missed doses. Please advise

## 2024-03-09 MED ORDER — ESTRADIOL 10 MCG VA TABS: 10.0000 ug | ORAL_TABLET | VAGINAL | 1 refills | Status: DC

## 2024-03-10 ENCOUNTER — Ambulatory Visit: Payer: 59 | Admitting: Obstetrics and Gynecology

## 2024-03-13 ENCOUNTER — Ambulatory Visit: Admitting: Physician Assistant

## 2024-03-17 ENCOUNTER — Ambulatory Visit: Admitting: Obstetrics

## 2024-03-18 ENCOUNTER — Other Ambulatory Visit: Payer: Self-pay | Admitting: Obstetrics and Gynecology

## 2024-03-18 DIAGNOSIS — S239XXD Sprain of unspecified parts of thorax, subsequent encounter: Secondary | ICD-10-CM | POA: Diagnosis not present

## 2024-03-18 MED ORDER — ESTRADIOL 0.01 % VA CREA: TOPICAL_CREAM | VAGINAL | 1 refills | Status: DC

## 2024-03-18 NOTE — Progress Notes (Signed)
 Rx or vaginal estradiol  cream.

## 2024-03-18 NOTE — Telephone Encounter (Signed)
 Pt called to say the prescription for Yuvafem  is too expensive with her insurance, and she would like to know if there are any alternatives that are more economical for her to try?  [phone]

## 2024-03-26 NOTE — Telephone Encounter (Signed)
 Patient notified  All questions answered

## 2024-03-28 ENCOUNTER — Institutional Professional Consult (permissible substitution): Admitting: Psychology

## 2024-03-28 ENCOUNTER — Ambulatory Visit: Payer: Self-pay

## 2024-03-31 DIAGNOSIS — M47812 Spondylosis without myelopathy or radiculopathy, cervical region: Secondary | ICD-10-CM | POA: Diagnosis not present

## 2024-04-04 ENCOUNTER — Encounter: Admitting: Psychology

## 2024-04-15 DIAGNOSIS — M47812 Spondylosis without myelopathy or radiculopathy, cervical region: Secondary | ICD-10-CM | POA: Diagnosis not present

## 2024-04-25 DIAGNOSIS — U071 COVID-19: Secondary | ICD-10-CM | POA: Diagnosis not present

## 2024-05-03 ENCOUNTER — Emergency Department (HOSPITAL_BASED_OUTPATIENT_CLINIC_OR_DEPARTMENT_OTHER): Admitting: Radiology

## 2024-05-03 ENCOUNTER — Other Ambulatory Visit: Payer: Self-pay

## 2024-05-03 ENCOUNTER — Encounter (HOSPITAL_BASED_OUTPATIENT_CLINIC_OR_DEPARTMENT_OTHER): Payer: Self-pay

## 2024-05-03 ENCOUNTER — Emergency Department (HOSPITAL_BASED_OUTPATIENT_CLINIC_OR_DEPARTMENT_OTHER)
Admission: EM | Admit: 2024-05-03 | Discharge: 2024-05-03 | Disposition: A | Discharge status: Home / Self Care | Attending: Emergency Medicine | Admitting: Emergency Medicine

## 2024-05-03 DIAGNOSIS — J101 Influenza due to other identified influenza virus with other respiratory manifestations: Secondary | ICD-10-CM | POA: Diagnosis not present

## 2024-05-03 DIAGNOSIS — R059 Cough, unspecified: Secondary | ICD-10-CM | POA: Diagnosis not present

## 2024-05-03 LAB — RESP PANEL BY RT-PCR (RSV, FLU A&B, COVID)  RVPGX2
Influenza A by PCR: POSITIVE — AB
Influenza B by PCR: NEGATIVE
Resp Syncytial Virus by PCR: NEGATIVE
SARS Coronavirus 2 by RT PCR: NEGATIVE

## 2024-05-03 NOTE — ED Triage Notes (Signed)
 She reports being dx with COVID a few days ago by her [facility] pcp. Placed on Paxlovid and in spite of this her sx are getting worse.

## 2024-05-03 NOTE — ED Provider Notes (Signed)
 " [place] EMERGENCY DEPARTMENT AT [facility] Provider Note   CSN: [id number] Arrival date & time: 05/03/24  1231     Patient presents with: URI   Anna Chavez is a 63 y.o. female.   Patient with no significant past pulmonary history, no smoking, reports receiving flu vaccine --presents to the emergency department today for evaluation of ongoing flulike illness.  Patient became sick about a week and a half ago.  She was seen on 12/12 after having a home COVID test, prescribed Paxlovid.  She started to recover after a few days, however about 3 to 4 days ago became acutely sick again.  She describes headache, body aches, recurrent cough.  She had a sore throat that has improved.  She has extremely decreased energy.  She has decreased appetite but no vomiting.  She was concerned because she is feeling worse.       Prior to Admission medications  Medication Sig Start Date End Date Taking? Authorizing Provider  estradiol  (ESTRACE ) 0.01 % CREA vaginal cream Use 1/2 g vaginally every night for the first 2 weeks, then use 1/2 g vaginally two or three times per week as needed to maintain symptom relief. 03/18/24   Amundson C Silva, Brook E, MD  ALPRAZolam (XANAX) 0.5 MG tablet Take 0.5 mg by mouth 2 (two) times daily as needed. 02/03/21   [provider]  busPIRone (BUSPAR) 15 MG tablet Take 15 mg by mouth 2 (two) times daily. 09/21/17   [provider]  busPIRone (BUSPAR) 5 MG tablet Take 5 mg by mouth 2 (two) times daily. 10/25/23   [provider]  DULoxetine (CYMBALTA) 20 MG capsule Take 40 mg by mouth daily. 12/24/22   [provider]  gabapentin  (NEURONTIN ) 100 MG capsule Take 100 mg by mouth 3 (three) times daily. 10/30/23   [provider]  gabapentin  (NEURONTIN ) 300 MG capsule Take 1 capsule (300 mg) nightly. Patient taking differently: Take 300 mg by mouth at bedtime. Take 1 capsule (300 mg) nightly. 04/04/21   Amundson C Silva,  Brook E, MD  ibuprofen  (ADVIL ) 800 MG tablet Take 1 tablet (800 mg total) by mouth every 8 (eight) hours as needed. 09/06/23   Cornett, Debby, MD  methocarbamol  (ROBAXIN ) 500 MG tablet Take 1 tablet (500 mg total) by mouth every 6 (six) hours as needed for muscle spasms. 12/28/23   Armenta Canning, MD  mirabegron  ER (MYRBETRIQ ) 25 MG TB24 tablet Take 1 tablet (25 mg total) by mouth daily. Patient not taking: Reported on 07/09/2023 07/06/23   Guadlupe Dull T, MD  Multiple Vitamin (MULTIVITAMIN) capsule Take 1 capsule by mouth daily.    [provider]  oxyCODONE  (OXY IR/ROXICODONE ) 5 MG immediate release tablet Take 1 tablet (5 mg total) by mouth every 6 (six) hours as needed for severe pain (pain score 7-10). 09/06/23   Cornett, Debby, MD  oxyCODONE -acetaminophen  (PERCOCET/ROXICET) 5-325 MG tablet Take 1 tablet by mouth every 6 (six) hours as needed for severe pain (pain score 7-10). 12/28/23   Armenta Canning, MD  predniSONE  (STERAPRED UNI-PAK 21 TAB) 10 MG (21) TBPK tablet Take by mouth daily. Take 6 tabs by mouth daily  for 2 days, then 5 tabs for 2 days, then 4 tabs for 2 days, then 3 tabs for 2 days, 2 tabs for 2 days, then 1 tab by mouth daily for 2 days 12/28/23   Armenta Canning, MD  valACYclovir  (VALTREX ) 500 MG tablet Take 1 tablet (500 mg total) by  mouth daily. Take one tablet by mouth twice a day for 3 days for an outbreak. 07/09/23   Cathlyn JAYSON Nikki Bobie FORBES, MD  Vibegron  (GEMTESA ) 75 MG TABS Take 1 tablet (75 mg total) by mouth daily. 05/25/23   Guadlupe Lianne DASEN, MD    Allergies: Patient has no known allergies.    Review of Systems  Updated Vital Signs BP (!) 125/108 (BP Location: Right Arm)   Pulse (!) 106   Temp 98.3 F (36.8 C)   Resp 16   LMP 05/15/2016   SpO2 100%   Physical Exam Vitals and nursing note reviewed.  Constitutional:      Appearance: She is well-developed.  HENT:     Head: Normocephalic and atraumatic.     Jaw: No trismus.     Right Ear: Tympanic membrane,  ear canal and external ear normal.     Left Ear: Tympanic membrane, ear canal and external ear normal.     Nose: Congestion and rhinorrhea present. No mucosal edema.     Mouth/Throat:     Mouth: Mucous membranes are moist. Mucous membranes are not dry. No oral lesions.     Pharynx: Uvula midline. No oropharyngeal exudate, posterior oropharyngeal erythema or uvula swelling.     Tonsils: No tonsillar abscesses.  Eyes:     General:        Right eye: No discharge.        Left eye: No discharge.     Conjunctiva/sclera: Conjunctivae normal.  Cardiovascular:     Rate and Rhythm: Normal rate and regular rhythm.     Heart sounds: Normal heart sounds.  Pulmonary:     Effort: Pulmonary effort is normal. No respiratory distress.     Breath sounds: Normal breath sounds. No wheezing or rales.  Abdominal:     Palpations: Abdomen is soft.     Tenderness: There is no abdominal tenderness.  Musculoskeletal:     Cervical back: Normal range of motion and neck supple.  Lymphadenopathy:     Cervical: No cervical adenopathy.  Skin:    General: Skin is warm and dry.  Neurological:     Mental Status: She is alert.  Psychiatric:        Mood and Affect: Mood normal. Affect is tearful.     (all labs ordered are listed, but only abnormal results are displayed) Labs Reviewed  RESP PANEL BY RT-PCR (RSV, FLU A&B, COVID)  RVPGX2 - Abnormal; Notable for the following components:      Result Value   Influenza A by PCR POSITIVE (*)    All other components within normal limits    EKG: None  Radiology: DG Chest 2 View Result Date: 05/03/2024 CLINICAL DATA:  Cough. EXAM: CHEST - 2 VIEW COMPARISON:  12/28/2023 FINDINGS: The cardiomediastinal contours are normal. The lungs are clear. Pulmonary vasculature is normal. No consolidation, pleural effusion, or pneumothorax. No acute osseous abnormalities are seen. Right breast/chest wall surgical clips. IMPRESSION: No active cardiopulmonary disease. Electronically  Signed   By: Andrea Gasman M.D.   On: 05/03/2024 14:04     Procedures   Medications Ordered in the ED - No data to display  ED Course  Patient seen and examined. History obtained directly from patient. Work-up including labs, imaging, EKG ordered in triage, if performed, were reviewed.    Labs/EKG: Independently reviewed and interpreted.  This included: Respiratory panel positive for flu A  Imaging: Independently reviewed and interpreted.  This included: Chest x-ray, agree negative.  Medications/Fluids: Offered Toradol , patient initially agreed but then decided she wanted to take ibuprofen  at home.  Most recent vital signs reviewed and are as follows: BP (!) 125/108 (BP Location: Right Arm)   Pulse (!) 106   Temp 98.3 F (36.8 C)   Resp 16   LMP 05/15/2016   SpO2 100%   Initial impression: Patient with likely influenza A after recent COVID infection.  No secondary pneumonia noted.  Home treatment plan: Continued over-the-counter medications, symptom control  Return instructions discussed with patient: Return with worsening shortness of breath, trouble breathing, high persistent fever, persistent vomiting.  Follow-up instructions discussed with patient: PCP in 5 days if not improving.                                   Medical Decision Making Amount and/or Complexity of Data Reviewed Radiology: ordered.   Patient presents with flulike symptoms.  Testing positive for influenza.  Patient is well-appearing, but understandably frustrated about being ill.  Vital signs are stable, fevers controlled.  No clinical signs and symptoms of significant dehydration and is currently tolerating fluids.  Considered secondary infection such as pneumonia, however lungs are clear on exam and patient without hypoxia.  Chest x-ray was ordered and is negative.  Low concern for sepsis.  In addition, low clinical concern for mononucleosis.   Given well appearance, supportive therapy  indicated currently.  Discussed signs and symptoms to return with patient at bedside.  Patient seems reliable to return as needed.       Final diagnoses:  Influenza A    ED Discharge Orders     None          Desiderio Chew, PA-C 05/03/24 1441    Ruthe Cornet, DO 05/03/24 1444  "

## 2024-05-03 NOTE — Discharge Instructions (Signed)
 Please read and follow all provided instructions.  Your diagnoses today include:  1. Influenza A    Tests performed today include: Flu, COVID, RSV: Positive for influenza A Chest x-ray: Did not show any signs of pneumonia Vital signs. See below for your results today.   Medications prescribed:  Please use over-the-counter NSAID medications (ibuprofen , naproxen) or Tylenol  (acetaminophen ) as directed on the packaging for pain -- as long as you do not have any reasons avoid these medications. Reasons to avoid NSAID medications include: weak kidneys, a history of bleeding in your stomach or gut, or uncontrolled high blood pressure or previous heart attack. Reasons to avoid Tylenol  include: liver problems or ongoing alcohol use. Never take more than 4000mg  or 8 Extra strength Tylenol  in a 24 hour period.     Take any prescribed medications only as directed.  Home care instructions:  Follow any educational materials contained in this packet. Please continue drinking plenty of fluids. Use over-the-counter cold and flu medications as needed as directed on packaging for symptom relief. You may also use ibuprofen  or tylenol  as directed on packaging for pain or fever.   BE VERY CAREFUL not to take multiple medicines containing Tylenol  (also called acetaminophen ). Doing so can lead to an overdose which can damage your liver and cause liver failure and possibly death.   Follow-up instructions: Please follow-up with your primary care provider in the next 5-7 days for further evaluation of your symptoms if not improving.  Return instructions:  Please return to the Emergency Department if you experience worsening symptoms. Please return if you have a high fever greater than 101 degrees not controlled with over-the-counter medications, persistent vomiting and cannot keep down fluids, or worsening trouble breathing. Please return if you have any other emergent concerns.  Additional Information:  Your  vital signs today were: BP (!) 125/108 (BP Location: Right Arm)   Pulse (!) 106   Temp 98.3 F (36.8 C)   Resp 16   LMP 05/15/2016   SpO2 100%  If your blood pressure (BP) was elevated above 135/85 this visit, please have this repeated by your doctor within one month.

## 2024-05-13 ENCOUNTER — Ambulatory Visit: Admitting: Physician Assistant

## 2024-06-10 ENCOUNTER — Other Ambulatory Visit: Payer: Self-pay | Admitting: Obstetrics and Gynecology

## 2024-06-10 ENCOUNTER — Other Ambulatory Visit: Payer: Self-pay

## 2024-06-10 MED ORDER — ESTRADIOL 10 MCG VA TABS: 1.0000 | ORAL_TABLET | VAGINAL | 0 refills | Status: AC

## 2024-06-10 NOTE — Telephone Encounter (Signed)
" °*  Called Pt to clarify refill request. Pt states she never used the cream, never picked it up. She only uses the inserts twice a week.  Med refill request:   Estradiol  (VAGIFEM ) 10 MCG TABS vaginal tablet  Start:  03/10/24 Disp:  24 tablet (84 day supply)  Refills:  1  Last AEX:  07/09/23 Next AEX:  08/13/24 Last MMG (if hormonal med): 07/20/23 Refill authorized? Please Advise.  "

## 2024-08-13 ENCOUNTER — Ambulatory Visit: Admitting: Obstetrics and Gynecology
